# Patient Record
Sex: Female | Born: 1990 | Race: Black or African American | Hispanic: No | Marital: Single | Smoking: Never smoker
Health system: Southern US, Community
[De-identification: ages and names within clinical notes are randomized; demographics above are authoritative.]

## PROBLEM LIST (undated history)

## (undated) ENCOUNTER — Inpatient Hospital Stay (HOSPITAL_COMMUNITY): Payer: Self-pay

## (undated) ENCOUNTER — Emergency Department (HOSPITAL_COMMUNITY): Admission: EM | Payer: Self-pay | Source: Home / Self Care

## (undated) DIAGNOSIS — F419 Anxiety disorder, unspecified: Secondary | ICD-10-CM

## (undated) DIAGNOSIS — D649 Anemia, unspecified: Secondary | ICD-10-CM

## (undated) DIAGNOSIS — O3430 Maternal care for cervical incompetence, unspecified trimester: Secondary | ICD-10-CM

## (undated) DIAGNOSIS — N83209 Unspecified ovarian cyst, unspecified side: Secondary | ICD-10-CM

## (undated) DIAGNOSIS — B999 Unspecified infectious disease: Secondary | ICD-10-CM

## (undated) HISTORY — PX: NO PAST SURGERIES: SHX2092

## (undated) HISTORY — DX: Anxiety disorder, unspecified: F41.9

## (undated) HISTORY — DX: Unspecified infectious disease: B99.9

## (undated) HISTORY — DX: Maternal care for cervical incompetence, unspecified trimester: O34.30

---
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
## 2011-09-18 NOTE — L&D Delivery Note (Signed)
Delivery Note  Pt was having urge to push and vertex at +1, FHR w variable decel, AROM for clear fluid and pt pushed well, FHR stable  At 4:53 AM a viable female was delivered via Vaginal, Spontaneous Delivery (Presentation: Right Occiput Anterior).  Compound presentation w R arm, shoulders delivered easily, APGAR: 9, 9; weight 5 lb 13 oz (2637 g).   Placenta status: Intact, Spontaneous.  - to pathology Cord: 3 vessels with the following complications: None.  Cord pH: n/a  No IV was obtained, pt given cytotec PR, and pitocin 10units IM   Anesthesia: Local  Episiotomy: None Lacerations: 1st degree Suture Repair: 3.0 vicryl rapide Est. Blood Loss (mL): 300  Mom to postpartum.  Baby to nursery-stable. Skin-skin rooming in Pt plans to BF   Malekai Markwood M 06/05/2012, 6:07 AM

## 2012-02-29 ENCOUNTER — Inpatient Hospital Stay (HOSPITAL_COMMUNITY)
Admission: AD | Admit: 2012-02-29 | Discharge: 2012-02-29 | Disposition: A | Discharge status: Home / Self Care | Payer: Medicaid Other | Source: Ambulatory Visit | Attending: Obstetrics & Gynecology | Admitting: Obstetrics & Gynecology

## 2012-02-29 ENCOUNTER — Encounter (HOSPITAL_COMMUNITY): Payer: Self-pay | Admitting: *Deleted

## 2012-02-29 DIAGNOSIS — N390 Urinary tract infection, site not specified: Secondary | ICD-10-CM

## 2012-02-29 DIAGNOSIS — R109 Unspecified abdominal pain: Secondary | ICD-10-CM | POA: Insufficient documentation

## 2012-02-29 DIAGNOSIS — N949 Unspecified condition associated with female genital organs and menstrual cycle: Secondary | ICD-10-CM

## 2012-02-29 DIAGNOSIS — O99891 Other specified diseases and conditions complicating pregnancy: Secondary | ICD-10-CM | POA: Insufficient documentation

## 2012-02-29 DIAGNOSIS — R42 Dizziness and giddiness: Secondary | ICD-10-CM | POA: Insufficient documentation

## 2012-02-29 HISTORY — DX: Unspecified ovarian cyst, unspecified side: N83.209

## 2012-02-29 HISTORY — DX: Anemia, unspecified: D64.9

## 2012-02-29 LAB — WET PREP, GENITAL: Yeast Wet Prep HPF POC: NONE SEEN

## 2012-02-29 LAB — URINALYSIS, ROUTINE W REFLEX MICROSCOPIC
Hgb urine dipstick: NEGATIVE
Specific Gravity, Urine: 1.025 (ref 1.005–1.030)
Urobilinogen, UA: 0.2 mg/dL (ref 0.0–1.0)
pH: 6.5 (ref 5.0–8.0)

## 2012-02-29 LAB — URINE MICROSCOPIC-ADD ON

## 2012-02-29 MED ORDER — CEPHALEXIN 500 MG PO CAPS: 500.0000 mg | ORAL_CAPSULE | Freq: Four times a day (QID) | ORAL | Status: AC

## 2012-02-29 NOTE — MAU Note (Signed)
Pt states, " I've had sharp pain everywhere in my abdomen, and sometimes in the right lower abdomen for one week. Yesterday I was dizzy while sitting down then when I stood up it was worse but today I haven't felt dizzy."

## 2012-02-29 NOTE — MAU Provider Note (Signed)
History     CSN: [id number]  Arrival date and time: 02/29/12 1727   First Provider Initiated Contact with Patient 02/29/12 1833      Chief Complaint  Patient presents with  . Abdominal Pain  . Dizziness   HPI This is a 21 y.o. female at [redacted]w[redacted]d who presents with c/o lower abdominal pain, R>L.  Has had this pain for a week or two.  Denies bleeding or leaking. Does feel Flutters.   No fever or other somatic symptoms. Has not started prenatal care yet, but states has appt at [facility] in late June.   OB History    Grav Para Term Preterm Abortions TAB SAB Ect Mult Living   2 1 1       1       Past Medical History  Diagnosis Date  . Anemia   . Ovarian cyst     with current pregnancy     Past Surgical History  Procedure Date  . No past surgeries     Family History  Problem Relation Age of Onset  . Other Neg Hx     History  Substance Use Topics  . Smoking status: Never Smoker   . Smokeless tobacco: Never Used  . Alcohol Use: No    Allergies: No Known Allergies  Prescriptions prior to admission  Medication Sig Dispense Refill  . Prenatal Vit-Fe Fumarate-FA (PRENATAL MULTIVITAMIN) TABS Take 1 tablet by mouth daily.        ROS See HPI  Physical Exam   Blood pressure 125/62, pulse 80, temperature 97.8 F (36.6 C), temperature source Oral, resp. rate 20, height 5\' 4"  (1.626 m), weight 124 lb 4 oz (56.359 kg).  Physical Exam  Constitutional: She is oriented to person, place, and time. She appears well-developed and well-nourished.  HENT:  Head: Normocephalic.  Cardiovascular: Normal rate.   Respiratory: Effort normal.  GI: Soft. She exhibits no distension and no mass. There is tenderness. There is no rebound and no guarding.  Genitourinary: Vagina normal and uterus normal. No vaginal discharge found.       Cervix long and closed  Musculoskeletal: Normal range of motion.  Neurological: She is alert and oriented to person, place, and time.  Skin: Skin is warm and  dry.  Psychiatric: She has a normal mood and affect.   Results for orders placed during the hospital encounter of 02/29/12 (from the past 24 hour(s))  URINALYSIS, ROUTINE W REFLEX MICROSCOPIC     Status: Abnormal   Collection Time   02/29/12  5:56 PM      Component Value Range   Color, Urine YELLOW  YELLOW   APPearance CLEAR  CLEAR   Specific Gravity, Urine 1.025  1.005 - 1.030   pH 6.5  5.0 - 8.0   Glucose, UA NEGATIVE  NEGATIVE mg/dL   Hgb urine dipstick NEGATIVE  NEGATIVE   Bilirubin Urine NEGATIVE  NEGATIVE   Ketones, ur NEGATIVE  NEGATIVE mg/dL   Protein, ur NEGATIVE  NEGATIVE mg/dL   Urobilinogen, UA 0.2  0.0 - 1.0 mg/dL   Nitrite NEGATIVE  NEGATIVE   Leukocytes, UA SMALL (*) NEGATIVE  URINE MICROSCOPIC-ADD ON     Status: Abnormal   Collection Time   02/29/12  5:56 PM      Component Value Range   Squamous Epithelial / LPF FEW (*) RARE   WBC, UA 0-2  <3 WBC/hpf   RBC / HPF 0-2  <3 RBC/hpf   Bacteria, UA MANY (*) RARE  Wet prep negative except for WBCs  MAU Course  Procedures  MDM Will culture urine and treat presumptively.   Assessment and Plan  A:  SIUP at [redacted]w[redacted]d       Probable round ligament pain      Reassuring fetal status      Possible UTI       GC/Chlamydia and wet prep done  P:  Discharge home      Comfort measures      Rx keflex      Urine to culture  [facility] 02/29/2012, 6:56 PM

## 2012-02-29 NOTE — Discharge Instructions (Signed)
Urinary Tract Infection Infections of the urinary tract can start in several places. A bladder infection (cystitis), a kidney infection (pyelonephritis), and a prostate infection (prostatitis) are different types of urinary tract infections (UTIs). They usually get better if treated with medicines (antibiotics) that kill germs. Take all the medicine until it is gone. You or your child may feel better in a few days, but TAKE ALL MEDICINE or the infection may not respond and may become more difficult to treat. HOME CARE INSTRUCTIONS   Drink enough water and fluids to keep the urine clear or pale yellow. Cranberry juice is especially recommended, in addition to large amounts of water.   Avoid caffeine, tea, and carbonated beverages. They tend to irritate the bladder.   Alcohol may irritate the prostate.   Only take over-the-counter or prescription medicines for pain, discomfort, or fever as directed by your caregiver.  To prevent further infections:  Empty the bladder often. Avoid holding urine for long periods of time.   After a bowel movement, women should cleanse from front to back. Use each tissue only once.   Empty the bladder before and after sexual intercourse.  FINDING OUT THE RESULTS OF YOUR TEST Not all test results are available during your visit. If your or your child's test results are not back during the visit, make an appointment with your caregiver to find out the results. Do not assume everything is normal if you have not heard from your caregiver or the medical facility. It is important for you to follow up on all test results. SEEK MEDICAL CARE IF:   There is back pain.   Your baby is older than 3 months with a rectal temperature of 100.5 F (38.1 C) or higher for more than 1 day.   Your or your child's problems (symptoms) are no better in 3 days. Return sooner if you or your child is getting worse.  SEEK IMMEDIATE MEDICAL CARE IF:   There is severe back pain or lower  abdominal pain.   You or your child develops chills.   You have a fever.   Your baby is older than 3 months with a rectal temperature of 102 F (38.9 C) or higher.   Your baby is 31 months old or younger with a rectal temperature of 100.4 F (38 C) or higher.   There is nausea or vomiting.   There is continued burning or discomfort with urination.  MAKE SURE YOU:   Understand these instructions.   Will watch your condition.   Will get help right away if you are not doing well or get worse.  Document Released: 06/13/2005 Document Revised: 08/23/2011 Document Reviewed: 01/16/2007 [facility] Patient Information 2012 [place], [place].  Round Ligament Pain The round ligament is made up of muscle and fibrous tissue. It is attached to the uterus near the fallopian tube. The round ligament is located on both sides of the uterus and helps support the position of the uterus. It usually begins in the second trimester of pregnancy when the uterus comes out of the pelvis. The pain can come and go until the baby is delivered. Round ligament pain is not a serious problem and does not cause harm to the baby. CAUSE During pregnancy the uterus grows the most from the second trimester to delivery. As it grows, it stretches and slightly twists the round ligaments. When the uterus leans from one side to the other, the round ligament on the opposite side pulls and stretches. This can cause  pain. SYMPTOMS  Pain can occur on one side or both sides. The pain is usually a short, sharp, and pinching-like. Sometimes it can be a dull, lingering and aching pain. The pain is located in the lower side of the abdomen or in the groin. The pain is internal and usually starts deep in the groin and moves up to the outside of the hip area. Pain can occur with:  Sudden change in position like getting out of bed or a chair.   Rolling over in bed.   Coughing or sneezing.   Walking too much.   Any type of physical  activity.  DIAGNOSIS  Your caregiver will make sure there are no serious problems causing the pain. When nothing serious is found, the symptoms usually indicate that the pain is from the round ligament. TREATMENT   Sit down and relax when the pain starts.   Flex your knees up to your belly.   Lay on your side with a pillow under your belly (abdomen) and another one between your legs.   Sit in a hot bath for 15 to 20 minutes or until the pain goes away.  HOME CARE INSTRUCTIONS   Only take over-the-counter or prescriptions medicines for pain, discomfort or fever as directed by your caregiver.   Sit and stand slowly.   Avoid long walks if it causes pain.   Stop or lessen your physical activities if it causes pain.  SEEK MEDICAL CARE IF:   The pain does not go away with any of your treatment.   You need stronger medication for the pain.   You develop back pain that you did not have before with the side pain.  SEEK IMMEDIATE MEDICAL CARE IF:   You develop a temperature of 102 F (38.9 C) or higher.   You develop uterine contractions.   You develop vaginal bleeding.   You develop nausea, vomiting or diarrhea.   You develop chills.   You have pain when you urinate.  Document Released: 06/12/2008 Document Revised: 08/23/2011 Document Reviewed: 06/12/2008 [facility] Patient Information 2012 [place], [place].

## 2012-03-01 LAB — GC/CHLAMYDIA PROBE AMP, GENITAL: Chlamydia, DNA Probe: NEGATIVE

## 2012-03-03 LAB — URINE CULTURE: Colony Count: 2000

## 2012-03-07 ENCOUNTER — Other Ambulatory Visit: Payer: Self-pay | Admitting: Advanced Practice Midwife

## 2012-03-07 MED ORDER — PENICILLIN V POTASSIUM 500 MG PO TABS: 500.0000 mg | ORAL_TABLET | Freq: Four times a day (QID) | ORAL | Status: AC

## 2012-03-12 ENCOUNTER — Telehealth: Payer: Self-pay | Admitting: *Deleted

## 2012-03-12 NOTE — Telephone Encounter (Signed)
Called [facility] as requested and notified her of uti with GBS and need for antibiotics and that has already been sent to her pharmacy- instructed her to take penicillin as order 4 times a day until all medicine gone. Patient voices understanding.

## 2012-03-12 NOTE — Telephone Encounter (Signed)
Message copied by Gerome Apley on Wed Mar 12, 2012 12:10 PM ------      Message from: Aviva Signs      Created: Fri Mar 07, 2012 12:41 PM      Regarding: treatment for GBS urine       I put in an order for Penicillin for her GBS urine            Can you call her?

## 2012-03-14 ENCOUNTER — Ambulatory Visit (INDEPENDENT_AMBULATORY_CARE_PROVIDER_SITE_OTHER): Payer: Medicaid Other | Admitting: Obstetrics and Gynecology

## 2012-03-14 ENCOUNTER — Encounter: Payer: Medicaid Other | Admitting: Obstetrics and Gynecology

## 2012-03-14 DIAGNOSIS — Z331 Pregnant state, incidental: Secondary | ICD-10-CM

## 2012-03-14 NOTE — Progress Notes (Signed)
PT STATES WAS GIVEN RX FOR KEFLEX WHEN SEEN AT [facility]U. WAS JUST CALLED AND TOLD PCN WAS PRESCRIBED DUE TO +GBS IN URINE.  PER MK TO D/C KEFLEX.  PT STATES HAS CONT APPROX 5/DAY.  ONLY DRINKING 1 BOTTLE OF WATER. ADVISED TO INCREASE TO 8-10 GLASSES /DAY.  ANATOMY U/S SCHED AT [facility] W/U 03/18/12.  RECORDS REQUESTED FROM CLINIC FOR INITIAL U/S. PT TO CALL WITH CONTINUED CONT. OR ANY CONCERNS.  Pt verbalizes comprehension.

## 2012-03-15 LAB — PRENATAL PANEL VII
Antibody Screen: NEGATIVE
Basophils Absolute: 0 10*3/uL (ref 0.0–0.1)
Basophils Relative: 0 % (ref 0–1)
Eosinophils Absolute: 0.1 10*3/uL (ref 0.0–0.7)
Eosinophils Relative: 2 % (ref 0–5)
HCT: 31.6 % — ABNORMAL LOW (ref 36.0–46.0)
MCHC: 33.9 g/dL (ref 30.0–36.0)
MCV: 87.3 fL (ref 78.0–100.0)
Monocytes Absolute: 1.4 10*3/uL — ABNORMAL HIGH (ref 0.1–1.0)
RDW: 13.4 % (ref 11.5–15.5)
Rh Type: POSITIVE

## 2012-03-18 ENCOUNTER — Ambulatory Visit (INDEPENDENT_AMBULATORY_CARE_PROVIDER_SITE_OTHER): Payer: Medicaid Other | Admitting: Obstetrics and Gynecology

## 2012-03-18 ENCOUNTER — Ambulatory Visit (INDEPENDENT_AMBULATORY_CARE_PROVIDER_SITE_OTHER): Payer: Medicaid Other

## 2012-03-18 ENCOUNTER — Other Ambulatory Visit: Payer: Self-pay | Admitting: Obstetrics and Gynecology

## 2012-03-18 VITALS — BP 120/80 | Wt 129.0 lb

## 2012-03-18 DIAGNOSIS — F411 Generalized anxiety disorder: Secondary | ICD-10-CM

## 2012-03-18 DIAGNOSIS — Z331 Pregnant state, incidental: Secondary | ICD-10-CM

## 2012-03-18 DIAGNOSIS — F419 Anxiety disorder, unspecified: Secondary | ICD-10-CM

## 2012-03-18 DIAGNOSIS — O0930 Supervision of pregnancy with insufficient antenatal care, unspecified trimester: Secondary | ICD-10-CM

## 2012-03-18 DIAGNOSIS — J45909 Unspecified asthma, uncomplicated: Secondary | ICD-10-CM

## 2012-03-18 DIAGNOSIS — Z3689 Encounter for other specified antenatal screening: Secondary | ICD-10-CM

## 2012-03-18 LAB — POCT WET PREP (WET MOUNT)
Bacteria Wet Prep HPF POC: NEGATIVE
Trichomonas Wet Prep HPF POC: NEGATIVE
pH: 5

## 2012-03-18 LAB — US OB COMP + 14 WK

## 2012-03-18 LAB — HEMOGLOBINOPATHY EVALUATION
Hgb A2 Quant: 2.8 % (ref 2.2–3.2)
Hgb F Quant: 0 % (ref 0.0–2.0)

## 2012-03-18 LAB — HEMOGLOBIN: Hemoglobin: 10.7 g/dL — ABNORMAL LOW (ref 12.0–15.0)

## 2012-03-18 NOTE — Progress Notes (Signed)
Patient had been seen at the hospital for initial [facility] [place].  To f/u at office for anatomy USS and initial examination [place] [redacted]w[redacted]d G2p1001 Late to [facility] dated with LMP USS anatomy: VX Pres, Posterior placenta Fluid 75th % Gender suggestive of female - ?? Hypospadias, mildly hypocoiled umbilical cord will have f/u USS for growth eval Subjective:    Tracy Hanna is being seen today for her first obstetrical visit.  She reports that she ahd initial Ob care at [facility] Had been Dx with Strep B(urine) Tx 'ed Penicillin V 500mg s po QID x 7 days Non smoker, No drugs, no alcohol, Pap x2 years ago She is at [redacted]w[redacted]d gestation by USS dating and LMP 09/10/11 - certain of dates   Her obstetrical history is significant for: SVD x1 2009 no complications   Relationship with FOB: declines to disclose  Patient intend to breast feed.  Pregnancy history fully reviewed  Review of Systems Pertinent ROS is described in HPI   Objective:   BP 120/80  Wt 129 lb (58.514 kg)  LMP 09/10/2011 Wt Readings from Last 1 Encounters:  03/18/12 129 lb (58.514 kg)   BMI: <30  General: alert, cooperative and no distress Respiratory: clear to auscultation bilaterally Cardiovascular: regular rate and rhythm, S1, S2 normal, no murmur Gastrointestinal: soft, non-tender; no masses,  no organomegaly Extremities: extremities normal, no pain or edema Vaginal Bleeding: none  EXTERNAL GENITALIA: normal appearing vulva with no masses, tenderness or lesions VAGINA: no abnormal discharge or lesions CERVIX: no lesions or cervical motion tenderness UTERUS: gravid and consistent with 27 weeks ADNEXA: no masses palpable and nontender OB EXAM PELVIMETRY: outlet adequate   FHR:  155bpm doppler  Assessment:    Pregnancy at   Plan: To have ROB in 2 week     Prenatal panel reviewed and discussed with the patient: labs reviewed with patient Discussed GBS status Pap smear collected: Yes GC/Chlamydia collected:yes Discussion of  Genetic testing options: declined Prenatal vitamins recommended - refill requested Problem list reviewed and updated.  Plan of care: Review 1hr gtt next visit, review results of GC/ Chlamydia and Pap ROB: x 2 wks  Repeat USS x3 wks for growth and to check Hypo-coiled cord ? Possible better view of Hypospadias   Follow up in 2 wks with USS scheduled 3 weeks  Earl Gala CNM, [place] 03/18/2012 6:08 PM

## 2012-03-18 NOTE — Progress Notes (Signed)
Last Pap was 2 year ago per pt "WNL". Pt is unsure of her Pregravid Wt. Pt states she has no concerns at this time. 1 GTT today.

## 2012-03-21 LAB — PAP IG, CT-NG, RFX HPV ASCU: Chlamydia Probe Amp: NEGATIVE

## 2012-04-01 ENCOUNTER — Encounter: Payer: Medicaid Other | Admitting: Obstetrics and Gynecology

## 2012-04-03 ENCOUNTER — Encounter: Payer: Self-pay | Admitting: Obstetrics and Gynecology

## 2012-04-03 ENCOUNTER — Ambulatory Visit (INDEPENDENT_AMBULATORY_CARE_PROVIDER_SITE_OTHER): Payer: Medicaid Other | Admitting: Obstetrics and Gynecology

## 2012-04-03 DIAGNOSIS — O359XX0 Maternal care for (suspected) fetal abnormality and damage, unspecified, not applicable or unspecified: Secondary | ICD-10-CM

## 2012-04-03 DIAGNOSIS — O358XX0 Maternal care for other (suspected) fetal abnormality and damage, not applicable or unspecified: Secondary | ICD-10-CM

## 2012-04-03 NOTE — Progress Notes (Signed)
1 gtt 86 Hemoglobin 10.7 RPR NR No concerns per pt

## 2012-04-03 NOTE — Progress Notes (Signed)
Doing well. Reviewed labs. Needs [place] next week for re-check of ? Hypospadius, hypercoiled cord.,

## 2012-04-08 ENCOUNTER — Ambulatory Visit (INDEPENDENT_AMBULATORY_CARE_PROVIDER_SITE_OTHER): Payer: Medicaid Other | Admitting: Obstetrics and Gynecology

## 2012-04-08 ENCOUNTER — Ambulatory Visit (INDEPENDENT_AMBULATORY_CARE_PROVIDER_SITE_OTHER): Payer: Medicaid Other

## 2012-04-08 VITALS — BP 106/70 | Wt 131.0 lb

## 2012-04-08 DIAGNOSIS — Z3490 Encounter for supervision of normal pregnancy, unspecified, unspecified trimester: Secondary | ICD-10-CM

## 2012-04-08 DIAGNOSIS — Z331 Pregnant state, incidental: Secondary | ICD-10-CM

## 2012-04-08 DIAGNOSIS — O358XX0 Maternal care for other (suspected) fetal abnormality and damage, not applicable or unspecified: Secondary | ICD-10-CM

## 2012-04-08 LAB — US OB FOLLOW UP

## 2012-04-08 NOTE — Progress Notes (Signed)
Pt states she has no concerns today.  

## 2012-04-08 NOTE — Progress Notes (Signed)
[redacted]w[redacted]d F/u USS: Growth and Genital Anatomy WUJ:[id number]: Vx, Posterior Placenta, No Previa, Normal fluid: AFI =60th %tile Normal Growth = EFW 50th%tile Fetal Genetalia: Findings again suggest Hypospadias - See #3 Images. Differential to include labia with large clitoris, or Bifid scrotum with short penis > ambiguous Genitalia Patient reassured. No other complaints  No change in vaginal secretions. ROB 2 weeks

## 2012-04-09 ENCOUNTER — Encounter (HOSPITAL_COMMUNITY): Payer: Self-pay | Admitting: *Deleted

## 2012-04-09 ENCOUNTER — Inpatient Hospital Stay (HOSPITAL_COMMUNITY): Payer: Medicaid Other

## 2012-04-09 ENCOUNTER — Inpatient Hospital Stay (HOSPITAL_COMMUNITY)
Admission: AD | Admit: 2012-04-09 | Discharge: 2012-04-10 | DRG: 778 | Disposition: A | Discharge status: Home / Self Care | Payer: Medicaid Other | Source: Ambulatory Visit | Attending: Obstetrics and Gynecology | Admitting: Obstetrics and Gynecology

## 2012-04-09 ENCOUNTER — Encounter (HOSPITAL_COMMUNITY): Payer: Medicaid Other

## 2012-04-09 DIAGNOSIS — O4700 False labor before 37 completed weeks of gestation, unspecified trimester: Secondary | ICD-10-CM

## 2012-04-09 DIAGNOSIS — O3430 Maternal care for cervical incompetence, unspecified trimester: Secondary | ICD-10-CM

## 2012-04-09 DIAGNOSIS — O359XX0 Maternal care for (suspected) fetal abnormality and damage, unspecified, not applicable or unspecified: Secondary | ICD-10-CM

## 2012-04-09 DIAGNOSIS — Z22330 Carrier of Group B streptococcus: Secondary | ICD-10-CM

## 2012-04-09 DIAGNOSIS — IMO0001 Reserved for inherently not codable concepts without codable children: Secondary | ICD-10-CM | POA: Diagnosis present

## 2012-04-09 DIAGNOSIS — O09299 Supervision of pregnancy with other poor reproductive or obstetric history, unspecified trimester: Secondary | ICD-10-CM

## 2012-04-09 DIAGNOSIS — R9389 Abnormal findings on diagnostic imaging of other specified body structures: Secondary | ICD-10-CM

## 2012-04-09 DIAGNOSIS — Z3689 Encounter for other specified antenatal screening: Secondary | ICD-10-CM

## 2012-04-09 DIAGNOSIS — O0930 Supervision of pregnancy with insufficient antenatal care, unspecified trimester: Secondary | ICD-10-CM

## 2012-04-09 DIAGNOSIS — O358XX0 Maternal care for other (suspected) fetal abnormality and damage, not applicable or unspecified: Secondary | ICD-10-CM | POA: Diagnosis present

## 2012-04-09 DIAGNOSIS — O99891 Other specified diseases and conditions complicating pregnancy: Secondary | ICD-10-CM | POA: Diagnosis present

## 2012-04-09 HISTORY — DX: Maternal care for cervical incompetence, unspecified trimester: O34.30

## 2012-04-09 LAB — URINALYSIS, ROUTINE W REFLEX MICROSCOPIC
Bilirubin Urine: NEGATIVE
Glucose, UA: NEGATIVE mg/dL
Hgb urine dipstick: NEGATIVE
Ketones, ur: NEGATIVE mg/dL
Nitrite: NEGATIVE
pH: 6 (ref 5.0–8.0)

## 2012-04-09 LAB — COMPREHENSIVE METABOLIC PANEL
Albumin: 2.8 g/dL — ABNORMAL LOW (ref 3.5–5.2)
BUN: 9 mg/dL (ref 6–23)
Creatinine, Ser: 0.6 mg/dL (ref 0.50–1.10)
Potassium: 3.7 mEq/L (ref 3.5–5.1)
Total Protein: 6.6 g/dL (ref 6.0–8.3)

## 2012-04-09 LAB — CBC
HCT: 33.1 % — ABNORMAL LOW (ref 36.0–46.0)
MCV: 88.7 fL (ref 78.0–100.0)
RBC: 3.73 MIL/uL — ABNORMAL LOW (ref 3.87–5.11)
WBC: 9.3 10*3/uL (ref 4.0–10.5)

## 2012-04-09 LAB — WET PREP, GENITAL
Clue Cells Wet Prep HPF POC: NONE SEEN
Trich, Wet Prep: NONE SEEN
Yeast Wet Prep HPF POC: NONE SEEN

## 2012-04-09 LAB — URINE MICROSCOPIC-ADD ON

## 2012-04-09 MED ORDER — LACTATED RINGERS IV SOLN
INTRAVENOUS | Status: DC
  Administered 2012-04-09 – 2012-04-10 (×2): via INTRAVENOUS

## 2012-04-09 MED ORDER — HYDROXYZINE HCL 50 MG PO TABS
50.0000 mg | ORAL_TABLET | Freq: Four times a day (QID) | ORAL | Status: DC | PRN
  Filled 2012-04-09: qty 1

## 2012-04-09 MED ORDER — BETAMETHASONE SOD PHOS & ACET 6 (3-3) MG/ML IJ SUSP
12.0000 mg | INTRAMUSCULAR | Status: AC
  Administered 2012-04-09 – 2012-04-10 (×2): 12 mg via INTRAMUSCULAR
  Filled 2012-04-09 (×2): qty 2

## 2012-04-09 MED ORDER — MAGNESIUM SULFATE 40 G IN LACTATED RINGERS - SIMPLE
2.0000 g/h | INTRAVENOUS | Status: DC
  Administered 2012-04-10: 2 g/h via INTRAVENOUS
  Filled 2012-04-09 (×2): qty 500

## 2012-04-09 MED ORDER — CALCIUM CARBONATE ANTACID 500 MG PO CHEW: 2.0000 | CHEWABLE_TABLET | ORAL | Status: DC | PRN

## 2012-04-09 MED ORDER — ZOLPIDEM TARTRATE 5 MG PO TABS: 5.0000 mg | ORAL_TABLET | Freq: Every evening | ORAL | Status: DC | PRN

## 2012-04-09 MED ORDER — PRENATAL MULTIVITAMIN CH
1.0000 | ORAL_TABLET | Freq: Every day | ORAL | Status: DC
  Administered 2012-04-09 – 2012-04-10 (×2): 1 via ORAL
  Filled 2012-04-09 (×2): qty 1

## 2012-04-09 MED ORDER — ACETAMINOPHEN 325 MG PO TABS: 650.0000 mg | ORAL_TABLET | ORAL | Status: DC | PRN

## 2012-04-09 MED ORDER — DOCUSATE SODIUM 100 MG PO CAPS
100.0000 mg | ORAL_CAPSULE | Freq: Every day | ORAL | Status: DC
  Administered 2012-04-09 – 2012-04-10 (×2): 100 mg via ORAL
  Filled 2012-04-09 (×2): qty 1

## 2012-04-09 MED ORDER — MAGNESIUM SULFATE 40 G IN LACTATED RINGERS - SIMPLE: 2.0000 g/h | Freq: Once | INTRAVENOUS | Status: DC

## 2012-04-09 MED ORDER — MAGNESIUM SULFATE 40 MG/ML IJ SOLN: 4.0000 g | Freq: Once | INTRAMUSCULAR | Status: DC

## 2012-04-09 MED ORDER — MAGNESIUM SULFATE BOLUS VIA INFUSION
4.0000 g | Freq: Once | INTRAVENOUS | Status: AC
  Administered 2012-04-09: 4 g via INTRAVENOUS
  Filled 2012-04-09: qty 500

## 2012-04-09 MED ORDER — PENICILLIN G POTASSIUM 5000000 UNITS IJ SOLR
2.5000 10*6.[IU] | INTRAVENOUS | Status: DC
  Administered 2012-04-09 – 2012-04-10 (×8): 2.5 10*6.[IU] via INTRAVENOUS
  Filled 2012-04-09 (×12): qty 2.5

## 2012-04-09 MED ORDER — PENICILLIN G POTASSIUM 5000000 UNITS IJ SOLR
5.0000 10*6.[IU] | Freq: Once | INTRAVENOUS | Status: AC
  Administered 2012-04-09: 5 10*6.[IU] via INTRAVENOUS
  Filled 2012-04-09: qty 5

## 2012-04-09 NOTE — Progress Notes (Signed)
SW aware of consult, but had not had the ability to meet with patient to complete assessment today.  SW spoke to RN, who states that patient will go home tomorrow evening at the earliest.  SW asked RN if patient has contacted someone regarding the young child in the room with patient and she said no.  SW cannot do anything about this unless hospital staff feels that the situation is unsafe and CPS needs to be contacted.  At this point, SW does not feel this in the case.  SW to attempt to meet with patient in the morning.

## 2012-04-09 NOTE — Progress Notes (Signed)
Patient ID: Tracy Hanna, female   DOB: 11-Dec-1990, 21 y.o.   MRN: [id number] Tracy Hanna is a 21 y.o. G2P1001 at [redacted]w[redacted]d by LMP admitted for Preterm labor  Subjective: GI: negative GU: Denies: dysuria, frequency/urgency, vaginal bleeding, pelvic pain OB: Good fetal movement        Objective: BP 115/63  Pulse 90  Temp 98.5 F (36.9 C) (Oral)  Resp 20  Ht 5\' 2"  (1.575 m)  Wt 122 lb 11.2 oz (55.656 kg)  BMI 22.44 kg/m2  SpO2 98%  LMP 09/10/2011 I/O last 3 completed shifts: In: 1400.4 [P.O.:360; I.V.:690.4; IV Piggyback:350] Out: 500 [Urine:500] Total I/O In: 1665 [P.O.:540; I.V.:1125] Out: -   FHT:  FHR: 140s bpm, variability: minimal ,  accelerations:  Abscent,  decelerations:  Absent UC:   rare SVE:   Dilation: 3 Effacement (%): 70 Station: -2 Exam by:: S Lillard CNM  Labs: Lab Results  Component Value Date   WBC 9.3 04/09/2012   HGB 10.7* 04/09/2012   HCT 33.1* 04/09/2012   MCV 88.7 04/09/2012   PLT 222 04/09/2012    Assessment : Preterm labor, with contractions resolved on Magnesium Unclear gender determination on prior U/S  Fetal Wellbeing:  Category II  Plan: Second BMZ due at 2am Will discontinue Magnesium at 8am, and observe for stability Results of MFM consult and U/S pending. 1hr glucola in 2 weeks Robi Dewolfe P 04/09/2012, 5:39 PM

## 2012-04-09 NOTE — H&P (Signed)
Tracy Hanna is a 21 y.o. female presenting for ctx/cramping. Pt is G2P1 at [redacted]w[redacted]d that arrived unannounced with c/o ctx that started about 8pm, feels like they have gotten stronger and are a few minutes apart. She denies any LOF, VB, reports +FM.   Pregnancy significant for: 1. LTC at 27wks 2. ? Fetal genitalia, suggestive female hypospadium 3. ? Hx of PROM w prev preg at 32wks, had SVD at term 4. hypercoiled umbilical cord 5. Anxiety - no meds 6. +GBS bacturia - txd at 27wks  HPI: Pt was seen in [facility]U at 24wks for RLP and txd for UTI w keflex initially, ua cx was then +GBS and she was switched to PenVK PO. She began [facility] at 27wks. Anatomy US was done and gender was questionable w suggestive female hypospadias. Otherwise [place] was normal and c/w dates w EDC of 06/16/12. F/U US was done today and was otherwise normal, again ? Gender/hypospadias.  Pt had normal 1hr gtt, hgb =10.7, RPR NR. Pt had pap smear done w NOB exam on 03-18-12, results are not available in epic today.  Maternal Medical History:  Reason for admission: Reason for admission: contractions.  Contractions: Onset was 3-5 hours ago.   Frequency: regular.   Duration is approximately 50 seconds.   Perceived severity is moderate.    Fetal activity: Perceived fetal activity is normal.   Last perceived fetal movement was within the past hour.    Prenatal complications: no prenatal complications   OB History    Grav Para Term Preterm Abortions TAB SAB Ect Mult Living   2 1 1       1      Per pt, was admitted at 32wks for ?PROM, no ctx, had SVD at term   Past Medical History  Diagnosis Date  . Anemia   . Ovarian cyst     with current pregnancy   . Anxiety AGE 52    NO MEDS CURRENTLY  . Infection     UTI WITH PREGNANCY  . Infection 2008    TRICH  . Infection     OCC yeast   Past Surgical History  Procedure Date  . No past surgeries    Family History: family history includes Arthritis in her maternal grandfather  and maternal grandmother; Asthma in her brother, father, maternal aunt, and sister; Diabetes in her maternal grandmother; Early death (age of onset:5) in her sister; and Hypertension in her father, maternal grandmother, and mother.  There is no history of Other. Social History:  reports that she has never smoked. She has never used smokeless tobacco. She reports that she does not drink alcohol or use illicit drugs.   Prenatal Transfer Tool  Maternal Diabetes: No Genetic Screening: Declined Maternal Ultrasounds/Referrals: Abnormal:  Findings:   Other: Please see prenatal record for details Fetal Ultrasounds or other Referrals:  None Maternal Substance Abuse:  No Significant Maternal Medications:  None Significant Maternal Lab Results:  None Other Comments:  ? fetal gender, suggestive fetal hypospadias   Review of Systems  All other systems reviewed and are negative.    Dilation: 3 Effacement (%): 70 Station: -2 Exam by:: S Kadin Canipe CNM Blood pressure 126/73, pulse 88, temperature 97.9 F (36.6 C), temperature source Oral, resp. rate 16, height 5\' 2"  (1.575 m), weight 133 lb (60.328 kg), last menstrual period 09/10/2011, SpO2 99.00%. Maternal Exam:  Uterine Assessment: Contraction strength is mild.  Contraction duration is 50 seconds. Contraction frequency is regular.   Abdomen: Patient reports  no abdominal tenderness. Fundal height is aga.   Estimated fetal weight is 3#4oz on [place] 04/08/12 .   Fetal presentation: vertex  Introitus: Normal vulva. Vagina is positive for vaginal discharge.  Ferning test: not done.  lg amt clear/milky discharge  Pelvis: adequate for delivery.   Cervix: Cervix evaluated by sterile speculum exam and digital exam.   Cx=3/75/0 vtx  Fetal Exam Fetal Monitor Review: Mode: ultrasound.   Baseline rate: 140.  Variability: moderate (6-25 bpm).   Pattern: no accelerations and no decelerations.    Fetal State Assessment: Category II - tracings are  indeterminate.     Physical Exam  Nursing note and vitals reviewed. Constitutional: She is oriented to person, place, and time. She appears well-developed and well-nourished. No distress.       9year old daughter at bs  HENT:  Head: Normocephalic.  Neck: Normal range of motion. Neck supple.  Cardiovascular: Normal rate, regular rhythm and normal heart sounds.   Respiratory: Effort normal and breath sounds normal.  GI: Soft. Bowel sounds are normal.  Genitourinary: Cervix exhibits discharge and friability. Vaginal discharge found.  Musculoskeletal: Normal range of motion. She exhibits no edema.  Neurological: She is alert and oriented to person, place, and time.  Skin: Skin is warm and dry.  Psychiatric: She has a normal mood and affect. Her behavior is normal. Judgment and thought content normal.       Pt tearful when discussing admission to hospital     Prenatal labs: ABO, Rh: A/POS/-- (06/28 1122) Antibody: NEG (06/28 1122) Rubella: 149.4 (06/28 1122) RPR: NON REAC (07/02 1539)  HBsAg: NEGATIVE (06/28 1122)  HIV: NON REACTIVE (06/28 1122)  GBS:   pos bacturia GC/CT neg hgb electroph - neg Pap - pending  Assessment/Plan: IUP at [redacted]w[redacted]d Preterm labor FHR reassuring, 10x10 accels  Admit to antenatal per consult w Dr Anise Salvo Sulfate 4g bolus then 2g/hour BMZ course Pen G units then 2.5 every 4 hours GC/CT sent Wet prep - neg, except for +WBC's CBC, CMET  [facility] - sent to cx NICU consult    Bhavesh Vazquez M 04/09/2012, 2:15 AM

## 2012-04-09 NOTE — MAU Note (Signed)
Pt states ucs getting stronger.

## 2012-04-09 NOTE — Consult Note (Signed)
Asked by Dr Normand Sloop to speak to Tracy Hanna regarding outcome of preterm infant. I reviewed the chart and spoke to her before MFM note was available. Maternal Hx: 30 2/[redacted] weeks gestation, GBS bacteriuria. Fetal US showed growrh at 50%, normal amniotic fluid, hypospadias noted. Mom received Pen G, Betamethasone, and Magnesium sulfate. I discussed general outcome and morbidities at about 30 wks. Discussed resuscitation, RDS, various respiratory support, HAL, gavage feeding, immature immune system, and LOS. Discussed difference in outcome if pregnancy is carried through at least 34 weeks.  Discussed benefits of breastfeeding.  Thank you for this consult.  Tracy Hanna Q  Face to face time 30 min.

## 2012-04-09 NOTE — Progress Notes (Signed)
MFM Note  Ms. Tracy Hanna is a 21 year old G2P1 AA female at 30+ weeks who was admitted last night for preterm contractions. Ms. Tracy Hanna began her prenatal care at the [facility] a few weeks ago when she presented for abdominal pain. The urine culture obtained at that visit was positive for GBS. She was given a course of penicillin. Her first visit with [facility] was at 27 weeks. A fetal ultrasound revealed a normally grown singleton with normal amniotic fluid volume. 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 A follow-up US showed appropriate growth but the genitalia remained ambiguous.  Since admission, she has been given BMZ, magnesium sulfate and IV penicillin. Her admission cervical exam was 3/70%. Currently, she does not appear to be uncomfortable and reports only one contraction in the past hour.   Ultrasound today: singleton in cephalic position, normal AFV, posterior placenta, ambiguous genitalia without other abnormalities, EFW at the 31st %tile with the [facility] at the 9th %tile and UA dopplers were WNL for this GA  OB history: S/P SVD of a 5+14 female at 38 weeks; complicated only by possible PPROM and was hospitalized for two weeks and then dc'ed home.  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 Tracy Hanna would like dffDNA for genetic gender determination - could she come back to [facility] to get her blood drawn in the morning? (Must check test that includes evaluation of X and Y - it is separate from T21,18 and 13) 3) Follow-up ultrasound for growth in 3 weeks  It was a pleasure meeting Ms. Tracy Hanna and her daughter.  (Face-to-face consultation with patient: 45 min)

## 2012-04-09 NOTE — MAU Note (Signed)
Pt c/o contractions gradually more painful since 2000.

## 2012-04-09 NOTE — Plan of Care (Signed)
Problem: Consults Goal: Birthing Suites Patient Information Press F2 to bring up selections list   Pt < [redacted] weeks EGA     

## 2012-04-10 ENCOUNTER — Inpatient Hospital Stay (HOSPITAL_COMMUNITY): Payer: Medicaid Other

## 2012-04-10 ENCOUNTER — Other Ambulatory Visit: Payer: Self-pay

## 2012-04-10 DIAGNOSIS — O4700 False labor before 37 completed weeks of gestation, unspecified trimester: Secondary | ICD-10-CM

## 2012-04-10 LAB — GC/CHLAMYDIA PROBE AMP, GENITAL: Chlamydia, DNA Probe: NEGATIVE

## 2012-04-10 LAB — URINE CULTURE

## 2012-04-10 NOTE — Progress Notes (Signed)
Harmony drawn via MFM nurse.

## 2012-04-10 NOTE — Progress Notes (Signed)
mfm recommended labs drawn by their nurse here at patient's bedside. Pt tolerated procedure well. Reactive tracing noted by pt's RN and second reviewer [email or web address], rn. Pt ready to go home now.

## 2012-04-10 NOTE — Progress Notes (Signed)
UR Chart review completed.  

## 2012-04-10 NOTE — Progress Notes (Addendum)
Patient ID: Tracy Hanna, female   DOB: 01-01-1991, 21 y.o.   MRN: [id number] Tracy Hanna is a 21 y.o. G2P1001 at [redacted]w[redacted]d admitted for preterm labor  Hospital Day No: 2  Subjective: No lof, denies ctxs, vb and +FM  Pregnancy complications: preterm labor  Objective: BP 108/53  Pulse 94  Temp 98.2 F (36.8 C) (Oral)  Resp 18  Ht 5\' 2"  (1.575 m)  Wt 122 lb 11.2 oz (55.656 kg)  BMI 22.44 kg/m2  SpO2 98%  LMP 09/10/2011 I/O last 3 completed shifts: In: 6325.4 [P.O.:1860; I.V.:3515.4; IV Piggyback:950] Out: 1600 [Urine:1600] Total I/O In: 125 [I.V.:125] Out: 0   Physical Exam:  Gen: alert and oriented Chest/Lungs: cta bilaterally  Heart/Pulse: RRR  Abdomen: soft, gravid, nontender Uterine fundus: soft, nontender EXT: negative Homan's b/l  FHT:  Episodes of cat 1 otherwise reassuring at 30wks UC:   Rare now SVE:   Dilation: 3 Effacement (%): 70 Station: -2 Exam by:: dr Su Hilt bullotable (vertex on u/s 04/09/12)  Labs: Lab Results  Component Value Date   WBC 9.3 04/09/2012   HGB 10.7* 04/09/2012   HCT 33.1* 04/09/2012   MCV 88.7 04/09/2012   PLT 222 04/09/2012    Assessment and Plan: 30 3/7wks admitted in PTL.  Pt wants to go home.  Her cervix is unchanged.  I rec bedrest and pelvic rest.  Pt is agreeable.  Her appt is next week in the office.  Precautions reviewed.  Pt may actually transfer her care back to [facility] where she has more support (her mom and rest of her family live there).  Fetal status is overall reassuring.   Purcell Nails 04/10/2012, 2:13 PM

## 2012-04-10 NOTE — Discharge Summary (Addendum)
Obstetric Discharge Summary Reason for Admission: PTL Prenatal Procedures: NST and ultrasound Intrapartum Procedures: n/a Postpartum Procedures: n/a Complications-Operative and Postpartum: n/a Hemoglobin  Date Value Range Status  04/09/2012 10.7* 12.0 - 15.0 g/dL Final     HCT  Date Value Range Status  04/09/2012 33.1* 36.0 - 46.0 % Final    Physical Exam:  General: alert Lochia: n/a Uterine Fundus: firm, gravid Incision: n/a DVT Evaluation: No evidence of DVT seen on physical exam.  Discharge Diagnoses: PTL and ?Ambiguos Genitalia of the Fetus  Discharge Information: Date: 04/10/2012 Activity: pelvic rest and bedrest Diet: routine Medications: None Condition: stable Instructions: PTL precautions discussed with patient.  She will f/u ?ambiguous genitalia of fetus as outpt.  Pt having blood drawn by MFM for gender determination prior to discharge.  MFM rec growth u/s in 3wks.  Discharge to: home Follow-up Information    Follow up with [facility]. (next week in office as scheduled)           Laia Wiley Y 04/10/2012, 2:23 PM

## 2012-04-21 ENCOUNTER — Encounter: Payer: Self-pay | Admitting: Obstetrics and Gynecology

## 2012-04-22 ENCOUNTER — Ambulatory Visit (INDEPENDENT_AMBULATORY_CARE_PROVIDER_SITE_OTHER): Payer: Medicaid Other

## 2012-04-22 VITALS — BP 116/62 | Wt 131.5 lb

## 2012-04-22 DIAGNOSIS — O3430 Maternal care for cervical incompetence, unspecified trimester: Secondary | ICD-10-CM

## 2012-04-22 DIAGNOSIS — O358XX0 Maternal care for other (suspected) fetal abnormality and damage, not applicable or unspecified: Secondary | ICD-10-CM

## 2012-04-22 NOTE — Progress Notes (Signed)
No complaints

## 2012-04-22 NOTE — Progress Notes (Signed)
[redacted]w[redacted]d; 2-3ctxs/hr most of the day.  On BR & pelvic rest since d/c from hospital 7/25.  No Rx for prn Procardia.  FFN not sent, but will defer at present b/c will not change POC.  Received BMZ.  GFM.  Rev'd PTL precautions.  S<D and growth u/s scheduled for next week per MFM note from hospitalization for [facility] lag.   Support given and pt to f/u prn any concerns.  Disc'd weekly visits to continue and cervical check prn--deferred today.

## 2012-04-24 ENCOUNTER — Telehealth (HOSPITAL_COMMUNITY): Payer: Self-pay | Admitting: MS"

## 2012-04-24 NOTE — Telephone Encounter (Signed)
Called Clinton Gallant to discuss her Harmony, cell free fetal DNA testing to obtain information about fetal gender. We discussed that the testing indicates >99% probability for female (XX) fetus. Ms. Spieth asked if this meant there was still a concern that the baby could have "both girl and boy parts." Explained that the concern wasn't necessarily for the baby to have both parts but more for a female baby to have too small parts or a girl baby that might have too big parts. Also discussed that per review of ultrasound images from 04/09/12 ultrasound by Dr. Sherrie George, female external genitalia appeared prominent but may be normal. Reviewed with Ms. Coppock that it may not be clear until the baby is evaluated postnatally. Patient asked what could cause a girl to have enlarged girls parts. Explained that there are possible underlying genetic syndromes that can cause enlarged female genitalia and that genetic evaluation or testing may be performed postnatally, if warranted. Reviewed that newborn screening, performed before the baby leaves the hospital, assess for some genetic conditions, including one that could affect the development of female genitalia: congenital adrenal hyperplasia.   Additionally, these are within normal limits, showing a less than 1 in 10,000 risk for trisomies 21, 18 and 13.  This testing identifies > 99% of pregnancies with trisomy 21, >97% of pregnancies with trisomy 83, and >80% with trisomy 13; the false positive rate is <0.1% for all conditions.  She understands that this testing does not identify all genetic conditions.  All questions were answered to her satisfaction, she was encouraged to call with additional questions or concerns.  Quinn Plowman, MS Patent attorney

## 2012-04-29 ENCOUNTER — Ambulatory Visit (INDEPENDENT_AMBULATORY_CARE_PROVIDER_SITE_OTHER): Payer: Medicaid Other

## 2012-04-29 ENCOUNTER — Ambulatory Visit (INDEPENDENT_AMBULATORY_CARE_PROVIDER_SITE_OTHER): Payer: Medicaid Other | Admitting: Obstetrics and Gynecology

## 2012-04-29 DIAGNOSIS — IMO0002 Reserved for concepts with insufficient information to code with codable children: Secondary | ICD-10-CM

## 2012-04-29 DIAGNOSIS — O3690X0 Maternal care for fetal problem, unspecified, unspecified trimester, not applicable or unspecified: Secondary | ICD-10-CM

## 2012-04-29 DIAGNOSIS — O358XX0 Maternal care for other (suspected) fetal abnormality and damage, not applicable or unspecified: Secondary | ICD-10-CM

## 2012-04-29 DIAGNOSIS — O365990 Maternal care for other known or suspected poor fetal growth, unspecified trimester, not applicable or unspecified: Secondary | ICD-10-CM

## 2012-04-29 NOTE — Progress Notes (Signed)
[redacted]w[redacted]d The patient has been resting at home.  She has a history of preterm labor and a small for gestational age infant. Ultrasound: Single gestation, vertex, normal fluid, cervix 1.83 cm, 31 weeks and 4 days (5.8 percentile), normal Dopplers. Continue resting. Return office in one week.  Doppler studies and biophysical profile next week. Harmony test negative. Dr. Stefano Gaul

## 2012-04-29 NOTE — Progress Notes (Signed)
Pt stated no issues today.  

## 2012-04-30 LAB — US OB FOLLOW UP

## 2012-05-05 ENCOUNTER — Encounter: Payer: Self-pay | Admitting: Obstetrics and Gynecology

## 2012-05-05 ENCOUNTER — Ambulatory Visit (INDEPENDENT_AMBULATORY_CARE_PROVIDER_SITE_OTHER): Payer: Medicaid Other

## 2012-05-05 ENCOUNTER — Other Ambulatory Visit: Payer: Self-pay | Admitting: Emergency Medicine

## 2012-05-05 ENCOUNTER — Ambulatory Visit (INDEPENDENT_AMBULATORY_CARE_PROVIDER_SITE_OTHER): Payer: Medicaid Other | Admitting: Obstetrics and Gynecology

## 2012-05-05 VITALS — BP 90/70 | Wt 134.0 lb

## 2012-05-05 DIAGNOSIS — O365990 Maternal care for other known or suspected poor fetal growth, unspecified trimester, not applicable or unspecified: Secondary | ICD-10-CM

## 2012-05-05 DIAGNOSIS — IMO0002 Reserved for concepts with insufficient information to code with codable children: Secondary | ICD-10-CM

## 2012-05-05 NOTE — Progress Notes (Signed)
No complaints per pt

## 2012-05-05 NOTE — Progress Notes (Signed)
Doing well.  Female fetal gender by [facility] testing. [place] today:  Vtx, AFI 14.45, 50%ile. Cervical length stable at 1.55 BPP 8/8 Dopplers WNL. Has [place] scheduled NV for growth, fluid, BPP, and dopplers due to growth lag. Continues on BR at home.  [place] for BPP, AFI, dopplers NV.

## 2012-05-06 LAB — US OB FOLLOW UP

## 2012-05-08 ENCOUNTER — Other Ambulatory Visit: Payer: Self-pay | Admitting: Obstetrics and Gynecology

## 2012-05-08 DIAGNOSIS — IMO0002 Reserved for concepts with insufficient information to code with codable children: Secondary | ICD-10-CM

## 2012-05-13 ENCOUNTER — Other Ambulatory Visit: Payer: Self-pay | Admitting: Obstetrics and Gynecology

## 2012-05-13 ENCOUNTER — Ambulatory Visit (INDEPENDENT_AMBULATORY_CARE_PROVIDER_SITE_OTHER): Payer: Medicaid Other

## 2012-05-13 ENCOUNTER — Ambulatory Visit (INDEPENDENT_AMBULATORY_CARE_PROVIDER_SITE_OTHER): Payer: Medicaid Other | Admitting: Obstetrics and Gynecology

## 2012-05-13 VITALS — BP 110/60 | Wt 134.0 lb

## 2012-05-13 DIAGNOSIS — IMO0002 Reserved for concepts with insufficient information to code with codable children: Secondary | ICD-10-CM

## 2012-05-13 DIAGNOSIS — O365990 Maternal care for other known or suspected poor fetal growth, unspecified trimester, not applicable or unspecified: Secondary | ICD-10-CM

## 2012-05-13 DIAGNOSIS — Z331 Pregnant state, incidental: Secondary | ICD-10-CM

## 2012-05-13 NOTE — Progress Notes (Signed)
[redacted]w[redacted]d Sono: EFW  4 lbs 10 oz   21%           AFI normal    BPP 8/8   Dopplers normal           Vertex

## 2012-05-13 NOTE — Progress Notes (Signed)
[redacted]w[redacted]d Pt c/o increased abd pain and contractions(unable to time contractions) x 2 days.

## 2012-05-13 NOTE — Progress Notes (Deleted)
sono for growth:   EFW: 4lb 10oz +/- 6oz  65th%tile  AFI: 15.6 cm   BPP: 8/8   Vertex presentation.  Posterior placenta.  Normal fluid.  AFI = 55th%tile.   EFW = 23 rd%tile.  Baby is symentric. Correlate with previous study for interval weight/growth.   Normal umbilical dopplers.  S/D ratio 2.56 (mean for 35 weeks = 2.56) no loss of diastolic flow.  No flow reversal.  Cx not measured.

## 2012-05-14 LAB — US OB FOLLOW UP

## 2012-05-22 ENCOUNTER — Other Ambulatory Visit: Payer: Self-pay | Admitting: Obstetrics and Gynecology

## 2012-05-22 ENCOUNTER — Encounter: Payer: Medicaid Other | Admitting: Obstetrics and Gynecology

## 2012-05-22 ENCOUNTER — Other Ambulatory Visit: Payer: Medicaid Other

## 2012-05-22 DIAGNOSIS — O26849 Uterine size-date discrepancy, unspecified trimester: Secondary | ICD-10-CM

## 2012-06-05 ENCOUNTER — Inpatient Hospital Stay (HOSPITAL_COMMUNITY)
Admission: AD | Admit: 2012-06-05 | Discharge: 2012-06-07 | DRG: 775 | Disposition: A | Discharge status: Home / Self Care | Payer: Medicaid Other | Source: Ambulatory Visit | Attending: Obstetrics and Gynecology | Admitting: Obstetrics and Gynecology

## 2012-06-05 ENCOUNTER — Encounter (HOSPITAL_COMMUNITY): Payer: Self-pay | Admitting: *Deleted

## 2012-06-05 DIAGNOSIS — Z22330 Carrier of Group B streptococcus: Secondary | ICD-10-CM

## 2012-06-05 DIAGNOSIS — F419 Anxiety disorder, unspecified: Secondary | ICD-10-CM | POA: Diagnosis present

## 2012-06-05 DIAGNOSIS — O359XX0 Maternal care for (suspected) fetal abnormality and damage, unspecified, not applicable or unspecified: Secondary | ICD-10-CM

## 2012-06-05 DIAGNOSIS — O9903 Anemia complicating the puerperium: Secondary | ICD-10-CM | POA: Diagnosis not present

## 2012-06-05 DIAGNOSIS — D649 Anemia, unspecified: Secondary | ICD-10-CM | POA: Diagnosis not present

## 2012-06-05 DIAGNOSIS — O0930 Supervision of pregnancy with insufficient antenatal care, unspecified trimester: Secondary | ICD-10-CM

## 2012-06-05 DIAGNOSIS — O09299 Supervision of pregnancy with other poor reproductive or obstetric history, unspecified trimester: Secondary | ICD-10-CM

## 2012-06-05 DIAGNOSIS — O99892 Other specified diseases and conditions complicating childbirth: Secondary | ICD-10-CM | POA: Diagnosis present

## 2012-06-05 DIAGNOSIS — O328XX0 Maternal care for other malpresentation of fetus, not applicable or unspecified: Secondary | ICD-10-CM | POA: Diagnosis present

## 2012-06-05 DIAGNOSIS — O365990 Maternal care for other known or suspected poor fetal growth, unspecified trimester, not applicable or unspecified: Secondary | ICD-10-CM | POA: Diagnosis present

## 2012-06-05 LAB — CBC
HCT: 33.6 % — ABNORMAL LOW (ref 36.0–46.0)
Platelets: 244 10*3/uL (ref 150–400)
RBC: 3.94 MIL/uL (ref 3.87–5.11)
RDW: 14.6 % (ref 11.5–15.5)
WBC: 8.6 10*3/uL (ref 4.0–10.5)

## 2012-06-05 MED ORDER — INFLUENZA VIRUS VACC SPLIT PF IM SUSP
0.5000 mL | INTRAMUSCULAR | Status: AC
  Administered 2012-06-06: 0.5 mL via INTRAMUSCULAR
  Filled 2012-06-05 (×2): qty 0.5

## 2012-06-05 MED ORDER — ZOLPIDEM TARTRATE 5 MG PO TABS: 5.0000 mg | ORAL_TABLET | Freq: Every evening | ORAL | Status: DC | PRN

## 2012-06-05 MED ORDER — MEASLES, MUMPS & RUBELLA VAC ~~LOC~~ INJ
0.5000 mL | INJECTION | Freq: Once | SUBCUTANEOUS | Status: DC
  Filled 2012-06-05: qty 0.5

## 2012-06-05 MED ORDER — OXYTOCIN 40 UNITS IN LACTATED RINGERS INFUSION - SIMPLE MED: 62.5000 mL/h | Freq: Once | INTRAVENOUS | Status: DC

## 2012-06-05 MED ORDER — SIMETHICONE 80 MG PO CHEW: 80.0000 mg | CHEWABLE_TABLET | ORAL | Status: DC | PRN

## 2012-06-05 MED ORDER — ONDANSETRON HCL 4 MG/2ML IJ SOLN: 4.0000 mg | INTRAMUSCULAR | Status: DC | PRN

## 2012-06-05 MED ORDER — LACTATED RINGERS IV SOLN
INTRAVENOUS | Status: DC
  Administered 2012-06-05: 04:00:00 via INTRAVENOUS

## 2012-06-05 MED ORDER — LANOLIN HYDROUS EX OINT: TOPICAL_OINTMENT | CUTANEOUS | Status: DC | PRN

## 2012-06-05 MED ORDER — ONDANSETRON HCL 4 MG PO TABS: 4.0000 mg | ORAL_TABLET | ORAL | Status: DC | PRN

## 2012-06-05 MED ORDER — WITCH HAZEL-GLYCERIN EX PADS: 1.0000 "application " | MEDICATED_PAD | CUTANEOUS | Status: DC | PRN

## 2012-06-05 MED ORDER — OXYTOCIN 10 UNIT/ML IJ SOLN
10.0000 [IU] | Freq: Once | INTRAMUSCULAR | Status: DC
  Administered 2012-06-05: 10 [IU] via INTRAMUSCULAR
  Filled 2012-06-05: qty 1

## 2012-06-05 MED ORDER — ONDANSETRON HCL 4 MG/2ML IJ SOLN: 4.0000 mg | Freq: Four times a day (QID) | INTRAMUSCULAR | Status: DC | PRN

## 2012-06-05 MED ORDER — OXYCODONE-ACETAMINOPHEN 5-325 MG PO TABS: 1.0000 | ORAL_TABLET | ORAL | Status: DC | PRN

## 2012-06-05 MED ORDER — DIPHENHYDRAMINE HCL 25 MG PO CAPS: 25.0000 mg | ORAL_CAPSULE | Freq: Four times a day (QID) | ORAL | Status: DC | PRN

## 2012-06-05 MED ORDER — DOCUSATE SODIUM 100 MG PO CAPS
100.0000 mg | ORAL_CAPSULE | Freq: Two times a day (BID) | ORAL | Status: DC
  Administered 2012-06-05 – 2012-06-07 (×5): 100 mg via ORAL
  Filled 2012-06-05 (×5): qty 1

## 2012-06-05 MED ORDER — IBUPROFEN 600 MG PO TABS
600.0000 mg | ORAL_TABLET | Freq: Four times a day (QID) | ORAL | Status: DC
  Administered 2012-06-05 – 2012-06-07 (×9): 600 mg via ORAL
  Filled 2012-06-05 (×9): qty 1

## 2012-06-05 MED ORDER — TETANUS-DIPHTH-ACELL PERTUSSIS 5-2.5-18.5 LF-MCG/0.5 IM SUSP: 0.5000 mL | Freq: Once | INTRAMUSCULAR | Status: DC

## 2012-06-05 MED ORDER — FERROUS SULFATE 325 (65 FE) MG PO TABS
325.0000 mg | ORAL_TABLET | Freq: Two times a day (BID) | ORAL | Status: DC
  Administered 2012-06-05 – 2012-06-07 (×4): 325 mg via ORAL
  Filled 2012-06-05 (×4): qty 1

## 2012-06-05 MED ORDER — IBUPROFEN 600 MG PO TABS
600.0000 mg | ORAL_TABLET | Freq: Four times a day (QID) | ORAL | Status: DC | PRN
  Administered 2012-06-05: 600 mg via ORAL
  Filled 2012-06-05: qty 1

## 2012-06-05 MED ORDER — LACTATED RINGERS IV SOLN: 500.0000 mL | INTRAVENOUS | Status: DC | PRN

## 2012-06-05 MED ORDER — ACETAMINOPHEN 325 MG PO TABS: 650.0000 mg | ORAL_TABLET | ORAL | Status: DC | PRN

## 2012-06-05 MED ORDER — DIBUCAINE 1 % RE OINT: 1.0000 "application " | TOPICAL_OINTMENT | RECTAL | Status: DC | PRN

## 2012-06-05 MED ORDER — OXYCODONE-ACETAMINOPHEN 5-325 MG PO TABS
1.0000 | ORAL_TABLET | ORAL | Status: DC | PRN
  Administered 2012-06-05 (×2): 1 via ORAL
  Filled 2012-06-05 (×2): qty 1

## 2012-06-05 MED ORDER — CITRIC ACID-SODIUM CITRATE 334-500 MG/5ML PO SOLN: 30.0000 mL | ORAL | Status: DC | PRN

## 2012-06-05 MED ORDER — SODIUM CHLORIDE 0.9 % IV SOLN
2.0000 g | Freq: Once | INTRAVENOUS | Status: DC
  Filled 2012-06-05: qty 2000

## 2012-06-05 MED ORDER — OXYTOCIN BOLUS FROM INFUSION
500.0000 mL | Freq: Once | INTRAVENOUS | Status: DC
  Filled 2012-06-05: qty 500

## 2012-06-05 MED ORDER — PRENATAL MULTIVITAMIN CH
1.0000 | ORAL_TABLET | Freq: Every day | ORAL | Status: DC
  Administered 2012-06-05 – 2012-06-07 (×3): 1 via ORAL
  Filled 2012-06-05 (×3): qty 1

## 2012-06-05 MED ORDER — LIDOCAINE HCL (PF) 1 % IJ SOLN
30.0000 mL | INTRAMUSCULAR | Status: DC | PRN
  Administered 2012-06-05: 30 mL via SUBCUTANEOUS
  Filled 2012-06-05: qty 30

## 2012-06-05 MED ORDER — SENNOSIDES-DOCUSATE SODIUM 8.6-50 MG PO TABS
2.0000 | ORAL_TABLET | Freq: Every day | ORAL | Status: DC
  Administered 2012-06-05 – 2012-06-06 (×2): 2 via ORAL

## 2012-06-05 MED ORDER — BENZOCAINE-MENTHOL 20-0.5 % EX AERO
1.0000 "application " | INHALATION_SPRAY | CUTANEOUS | Status: DC | PRN
  Administered 2012-06-05: 1 via TOPICAL
  Filled 2012-06-05: qty 56

## 2012-06-05 MED ORDER — MISOPROSTOL 200 MCG PO TABS
ORAL_TABLET | ORAL | Status: AC
  Administered 2012-06-05: 1000 ug
  Filled 2012-06-05: qty 5

## 2012-06-05 NOTE — Progress Notes (Signed)

## 2012-06-05 NOTE — H&P (Signed)
Tracy Hanna is a 21 y.o. female G2P1 at [redacted]w[redacted]d presenting for painful ctx, denies LOF, reports some bloody show, GFM.  cx 9cm, BBOW  HPI: Pt began PNC at 27wks at [facility], She was tx'd for +GBS bacteruria, and TOC was neg. At 30wks she began having PTL and was admitted to antepartum, rcv'd mag sulfate and BMZ course. Pt had f/u US at 33wks that identified SGA infant, she rcv'd genetic counseling secondary to US showing ambiguous genitalia, although harmony screen was WNL. Most recent visit was at 35wks, EFW at that time was 21%.   Maternal Medical History:  Reason for admission: Reason for admission: contractions.  Contractions: Onset was 6-12 hours ago.   Frequency: regular.   Duration is approximately 60 seconds.   Perceived severity is strong.    Fetal activity: Perceived fetal activity is normal.   Last perceived fetal movement was within the past hour.    Prenatal complications: Preterm labor.     OB History    Grav Para Term Preterm Abortions TAB SAB Ect Mult Living   2 2 2       2      Past Medical History  Diagnosis Date  . Anemia   . Ovarian cyst     with current pregnancy   . Anxiety AGE 21    NO MEDS CURRENTLY  . Infection     UTI WITH PREGNANCY  . Infection 2008    TRICH  . Infection     OCC yeast  . Premature dilatation of cervix during pregnancy 04/09/2012    3 cm   Past Surgical History  Procedure Date  . No past surgeries    Family History: family history includes Arthritis in her maternal grandfather and maternal grandmother; Asthma in her brother, father, maternal aunt, and sister; Diabetes in her maternal grandmother; Early death (age of onset:5) in her sister; and Hypertension in her father, maternal grandmother, and mother.  There is no history of Other. Social History:  reports that she has never smoked. She has never used smokeless tobacco. She reports that she does not drink alcohol or use illicit drugs.   Prenatal Transfer Tool  Maternal  Diabetes: No Genetic Screening: Normal Maternal Ultrasounds/Referrals: Abnormal:  Findings:   Other: ?ambiguous genitalia, harmony screen xx  Fetal Ultrasounds or other Referrals:  None Maternal Substance Abuse:  No Significant Maternal Medications:  None Significant Maternal Lab Results:  GBS pos  Other Comments:  limited PNC   Review of Systems  All other systems reviewed and are negative.    Dilation: 10 Effacement (%): 100 Station: +1 Exam by:: s. Rosilyn Coachman, cnm, Blood pressure 119/87, pulse 96, temperature 98.6 F (37 C), temperature source Oral, resp. rate 18, height 5\' 5"  (1.651 m), weight 137 lb (62.143 kg), last menstrual period 09/10/2011, unknown if currently breastfeeding. Maternal Exam:  Uterine Assessment: Contraction strength is firm.  Contraction duration is 60 seconds. Contraction frequency is regular.   Abdomen: Patient reports no abdominal tenderness. Fundal height is aga.   Estimated fetal weight is 5-6.   Fetal presentation: vertex  Introitus: Normal vulva. Normal vagina.  Pelvis: adequate for delivery.   Cervix: Cervix evaluated by digital exam.     Fetal Exam Fetal Monitor Review: Mode: ultrasound.   Baseline rate: 140.  Variability: moderate (6-25 bpm).   Pattern: no decelerations and variable decelerations.    Fetal State Assessment: Category II - tracings are indeterminate.     Physical Exam  Nursing note and vitals reviewed.  Constitutional: She is oriented to person, place, and time. She appears well-developed and well-nourished. She appears distressed.  HENT:  Head: Normocephalic.  Eyes: Pupils are equal, round, and reactive to light.  Neck: Normal range of motion.  Cardiovascular: Normal rate, regular rhythm and normal heart sounds.   GI: Soft. Bowel sounds are normal.  Genitourinary: Vagina normal.       Normal bloody show  Musculoskeletal: Normal range of motion. She exhibits no edema.  Neurological: She is alert and oriented to  person, place, and time.  Skin: Skin is warm and dry.  Psychiatric: She has a normal mood and affect. Her behavior is normal.    Prenatal labs: ABO, Rh: A/POS/-- (06/28 1122) Antibody: NEG (06/28 1122) Rubella: 149.4 (06/28 1122) RPR: NON REAC (07/02 1539)  HBsAg: NEGATIVE (06/28 1122)  HIV: NON REACTIVE (06/28 1122)  GBS: Positive (07/24 0000) in urine Pap neg GC/CT neg hgb electrophoresis neg Harmony screen WNL - XY 1hr gtt WNL, hgb 10.8, RPR NR    Assessment/Plan: IUP at [redacted]w[redacted]d Active labor GBS pos FHR cat 2  Admit to b.s.  Routine L&D orders Ancef 2g for GBS prophylaxis  Dr Su Hilt notified    Sanda Klein M 06/05/2012, 6:49 AM

## 2012-06-05 NOTE — Progress Notes (Signed)
Post Partum Day 1 Subjective: No complaints, up ad lib without syncope, voiding, tolerating PO, + flatus  Pain well controlled with po meds BF: had commenced but will need to see the Lactation Specialist today Mood stable, bonding well Contraception: Mirena IUD   Objective: Blood pressure 109/59, pulse 87, temperature 98.8 F (37.1 C), temperature source Oral, resp. rate 18, height 5\' 5"  (1.651 m), weight 137 lb (62.143 kg), last menstrual period 09/10/2011, unknown if currently breastfeeding.  Physical Exam:  General: alert, cooperative and no distress Lungs: CTAB Heart: RRR Breasts:N/T Lochia: appropriate Uterine Fundus: firm Perineum: DVT Evaluation: No evidence of DVT seen on physical exam. Negative Homan's sign. Postpartum Anemia: to start on Ferrous Sulfate 325 mgs po BID and Colace 100 mgs po BID           Basename 06/05/12 0409  HGB 10.8*  HCT 33.6*    Assessment/Plan: Plan for discharge tomorrow      LOS: 0 days   Ladonte Verstraete, CNM. 06/05/2012, 10:10 AM

## 2012-06-05 NOTE — MAU Note (Signed)
Pt reports having "intense contractions" about 20 min apart. Reports mucusy discharge.

## 2012-06-05 NOTE — Progress Notes (Signed)
UR chart review completed.  

## 2012-06-06 DIAGNOSIS — O0930 Supervision of pregnancy with insufficient antenatal care, unspecified trimester: Secondary | ICD-10-CM

## 2012-06-06 LAB — CBC
MCHC: 31.9 g/dL (ref 30.0–36.0)
RDW: 14.7 % (ref 11.5–15.5)

## 2012-06-06 NOTE — Progress Notes (Signed)
S: comfortable, little bleeding, slept     breastfeeding O VSS     abd soft, nt, ff      sm  Flow perineum clean intact     -Homans sign bilaterally,       No edema     Hemoglobin & Hematocrit     Component Value Date/Time   HGB 10.3* 06/06/2012 0530   HCT 32.3* 06/06/2012 0530   A normal involution     Lactating     PP day 1 P plans mirena IUDcontinue care Lavera Guise, CNM

## 2012-06-07 MED ORDER — IBUPROFEN 600 MG PO TABS: 600.0000 mg | ORAL_TABLET | Freq: Four times a day (QID) | ORAL | Status: DC | PRN

## 2012-06-07 NOTE — Discharge Summary (Signed)
Physician Discharge Summary  Patient ID: Tracy Hanna MRN: [id number] DOB/AGE: [birth year] 21 y.o.  Admit date: 06/05/2012 Discharge date: 06/07/2012  Admission Diagnoses: [redacted]w[redacted]d 10 cm with rapid delivery Antenatal course:PNC at 27wks at [facility], She was txPNC at 27wks at [facility], She was tx'd for +GBS bacteruria, and TOC was neg. At 30wks she began having PTL and was admitted to antepartum, rcv'd mag sulfate and BMZ course. Pt had f/u US at 33wks that identified SGA infant, she rcv'd genetic counseling secondary to US showing ambiguous genitalia, although harmony screen was WNL 'd for +GBS bacteruria, and TOC was neg. At 30wks she began having PTL and was admitted to antepartum, rcv'd mag sulfate and BMZ course. Pt had f/u US at 33wks that identified SGA infant, she rcv'd genetic counseling secondary to US showing ambiguous genitalia, although harmony screen was WNL    Discharge Diagnoses:  Active Problems:  Anxiety  NSVD (normal spontaneous vaginal delivery)   Discharged Condition: stable PNC at 27wks at [facility], She was tx'd for +GBS bacteruria, and TOC was neg. At 30wks she began having PTL and was admitted to antepartum, rcv'd mag sulfate and BMZ course. Pt had f/u US at 33wks that identified SGA infant, she rcv'd genetic counseling secondary to US showing ambiguous genitalia, although harmony screen was [facility]  Hospital Course: [facility] at 27wks at [facility], She was tx'd for +GBS bacteruria, and TOC was neg. At 30wks she began having PTL and was admitted to antepartum, rcv'd mag sulfate and BMZ course. Pt had f/u US at 33wks that identified SGA infant, she rcv'd genetic counseling secondary to US showing ambiguous genitalia, although harmony screen was WNL 10 cm on admission, rapid SVD, 1 degree vag lac, normal involution, plan Mirena IUD  Consults: None  Significant Diagnostic Studies: labs:  Hemoglobin & Hematocrit     Component Value Date/Time   HGB 10.3* 06/06/2012 0530   HCT 32.3* 06/06/2012 0530       Treatments: na  Discharge Exam: Blood pressure 105/69, pulse 93, temperature 97.9 F (36.6 C), temperature source Oral, resp. rate 18, height 5\' 5"  (1.651 m), weight 137 lb (62.143 kg), last menstrual period 09/10/2011, unknown if currently breastfeeding. General appearance: alert, cooperative and no distress S: comfortable, little bleeding, slept     breastfeeding O VSS     abd soft, nt, ff      sm  flowperineum clean intact 1 degree lac     -Homans sign bilaterally,      No edema A normal involution     Lactating     PP day 2   Disposition: 01-Home or Self Care  Discharge Orders    Future Orders Please Complete By Expires   OB RESULT CONSOLE Group B Strep      Comments:   This external order was created through the Results Console.       Medication List     As of 06/07/2012 10:06 AM    ASK your doctor about these medications         prenatal multivitamin Tabs   Take 1 tablet by mouth daily.       f/o 5 weeks, plan mirena IUD, [facility] handbook discussed.  SignedLavera Guise 06/07/2012, 10:06 AM

## 2012-07-10 ENCOUNTER — Ambulatory Visit: Payer: Medicaid Other | Admitting: Obstetrics and Gynecology

## 2012-07-19 ENCOUNTER — Encounter (HOSPITAL_COMMUNITY): Payer: Self-pay | Admitting: *Deleted

## 2012-07-19 ENCOUNTER — Emergency Department (HOSPITAL_COMMUNITY)
Admission: EM | Admit: 2012-07-19 | Discharge: 2012-07-20 | Disposition: A | Discharge status: Home / Self Care | Payer: Medicaid Other | Attending: Emergency Medicine | Admitting: Emergency Medicine

## 2012-07-19 DIAGNOSIS — K089 Disorder of teeth and supporting structures, unspecified: Secondary | ICD-10-CM | POA: Insufficient documentation

## 2012-07-19 NOTE — ED Notes (Signed)
Patient is alert and oriented x3.  She is complaining of right side dental pain. She currently rates her pain 10 of 10 that is her right jaw andy neck.  She denies any nausea of vomiting.

## 2012-07-20 NOTE — ED Provider Notes (Signed)
Please see downtime documentation  Otilio Miu, [place] 07/20/12 [phone]

## 2014-07-19 ENCOUNTER — Encounter (HOSPITAL_COMMUNITY): Payer: Self-pay | Admitting: *Deleted

## 2014-08-13 ENCOUNTER — Inpatient Hospital Stay (HOSPITAL_COMMUNITY): Payer: Medicaid Other

## 2014-08-13 ENCOUNTER — Encounter (HOSPITAL_COMMUNITY): Payer: Self-pay | Admitting: *Deleted

## 2014-08-13 ENCOUNTER — Inpatient Hospital Stay (HOSPITAL_COMMUNITY)
Admission: AD | Admit: 2014-08-13 | Discharge: 2014-08-13 | Disposition: A | Discharge status: Home / Self Care | Payer: Medicaid Other | Source: Ambulatory Visit | Attending: Obstetrics & Gynecology | Admitting: Obstetrics & Gynecology

## 2014-08-13 DIAGNOSIS — R109 Unspecified abdominal pain: Secondary | ICD-10-CM | POA: Insufficient documentation

## 2014-08-13 DIAGNOSIS — O468X1 Other antepartum hemorrhage, first trimester: Secondary | ICD-10-CM

## 2014-08-13 DIAGNOSIS — O418X10 Other specified disorders of amniotic fluid and membranes, first trimester, not applicable or unspecified: Secondary | ICD-10-CM

## 2014-08-13 DIAGNOSIS — O209 Hemorrhage in early pregnancy, unspecified: Secondary | ICD-10-CM | POA: Diagnosis present

## 2014-08-13 DIAGNOSIS — Z3A12 12 weeks gestation of pregnancy: Secondary | ICD-10-CM | POA: Diagnosis not present

## 2014-08-13 DIAGNOSIS — O26899 Other specified pregnancy related conditions, unspecified trimester: Secondary | ICD-10-CM

## 2014-08-13 LAB — CBC
HCT: 38.6 % (ref 36.0–46.0)
Hemoglobin: 12.8 g/dL (ref 12.0–15.0)
MCH: 29.2 pg (ref 26.0–34.0)
MCHC: 33.2 g/dL (ref 30.0–36.0)
MCV: 88.1 fL (ref 78.0–100.0)
Platelets: 321 10*3/uL (ref 150–400)
RBC: 4.38 MIL/uL (ref 3.87–5.11)
RDW: 14.8 % (ref 11.5–15.5)
WBC: 8.6 10*3/uL (ref 4.0–10.5)

## 2014-08-13 LAB — URINALYSIS, ROUTINE W REFLEX MICROSCOPIC
BILIRUBIN URINE: NEGATIVE
GLUCOSE, UA: NEGATIVE mg/dL
HGB URINE DIPSTICK: NEGATIVE
KETONES UR: NEGATIVE mg/dL
Leukocytes, UA: NEGATIVE
Nitrite: POSITIVE — AB
PH: 6.5 (ref 5.0–8.0)
Protein, ur: NEGATIVE mg/dL
SPECIFIC GRAVITY, URINE: 1.025 (ref 1.005–1.030)
Urobilinogen, UA: 0.2 mg/dL (ref 0.0–1.0)

## 2014-08-13 LAB — WET PREP, GENITAL
TRICH WET PREP: NONE SEEN
Yeast Wet Prep HPF POC: NONE SEEN

## 2014-08-13 LAB — OB RESULTS CONSOLE GC/CHLAMYDIA
Chlamydia: NEGATIVE
Gonorrhea: NEGATIVE

## 2014-08-13 LAB — URINE MICROSCOPIC-ADD ON

## 2014-08-13 LAB — HCG, QUANTITATIVE, PREGNANCY: hCG, Beta Chain, Quant, S: 155101 m[IU]/mL — ABNORMAL HIGH (ref <5)

## 2014-08-13 MED ORDER — CEPHALEXIN 500 MG PO CAPS: 500.0000 mg | ORAL_CAPSULE | Freq: Four times a day (QID) | ORAL | Status: DC

## 2014-08-13 NOTE — Discharge Instructions (Signed)
Subchorionic Hematoma °A subchorionic hematoma is a gathering of blood between the outer wall of the placenta and the inner wall of the womb (uterus). The placenta is the organ that connects the fetus to the wall of the uterus. The placenta performs the feeding, breathing (oxygen to the fetus), and waste removal (excretory work) of the fetus.  °Subchorionic hematoma is the most common abnormality found on a result from ultrasonography done during the first trimester or early second trimester of pregnancy. If there has been little or no vaginal bleeding, early small hematomas usually shrink on their own and do not affect your baby or pregnancy. The blood is gradually absorbed over 1-2 weeks. When bleeding starts later in pregnancy or the hematoma is larger or occurs in an older pregnant woman, the outcome may not be as good. Larger hematomas may get bigger, which increases the chances for miscarriage. Subchorionic hematoma also increases the risk of premature detachment of the placenta from the uterus, preterm (premature) labor, and stillbirth. °HOME CARE INSTRUCTIONS °· Stay on bed rest if your health care provider recommends this. Although bed rest will not prevent more bleeding or prevent a miscarriage, your health care provider may recommend bed rest until you are advised otherwise. °· Avoid heavy lifting (more than 10 lb [4.5 kg]), exercise, sexual intercourse, or douching as directed by your health care provider. °· Keep track of the number of pads you use each day and how soaked (saturated) they are. Write down this information. °· Do not use tampons. °· Keep all follow-up appointments as directed by your health care provider. Your health care provider may ask you to have follow-up blood tests or ultrasound tests or both. °SEEK IMMEDIATE MEDICAL CARE IF: °· You have severe cramps in your stomach, back, abdomen, or pelvis. °· You have a fever. °· You pass large clots or tissue. Save any tissue for your health  care provider to look at. °· Your bleeding increases or you become lightheaded, feel weak, or have fainting episodes. °Document Released: 12/19/2006 Document Revised: 01/18/2014 Document Reviewed: 04/02/2013 °ExitCare® Patient Information ©2015 [employer]. This information is not intended to replace advice given to you by your health care provider. Make sure you discuss any questions you have with your health care provider. °First Trimester of Pregnancy °The first trimester of pregnancy is from week 1 until the end of week 12 (months 1 through 3). During this time, your baby will begin to develop inside you. At 6-8 weeks, the eyes and face are formed, and the heartbeat can be seen on ultrasound. At the end of 12 weeks, all the baby's organs are formed. Prenatal care is all the medical care you receive before the birth of your baby. Make sure you get good prenatal care and follow all of your doctor's instructions. °HOME CARE  °Medicines °· Take medicine only as told by your doctor. Some medicines are safe and some are not during pregnancy. °· Take your prenatal vitamins as told by your doctor. °· Take medicine that helps you poop (stool softener) as needed if your doctor says it is okay. °Diet °· Eat regular, healthy meals. °· Your doctor will tell you the amount of weight gain that is right for you. °· Avoid raw meat and uncooked cheese. °· If you feel sick to your stomach (nauseous) or throw up (vomit): °· Eat 4 or 5 small meals a day instead of 3 large meals. °· Try eating a few soda crackers. °· Drink liquids between meals   instead of during meals. °· If you have a hard time pooping (constipation): °· Eat high-fiber foods like fresh vegetables, fruit, and whole grains. °· Drink enough fluids to keep your pee (urine) clear or pale yellow. °Activity and Exercise °· Exercise only as told by your doctor. Stop exercising if you have cramps or pain in your lower belly (abdomen) or low back. °· Try to avoid standing  for long periods of time. Move your legs often if you must stand in one place for a long time. °· Avoid heavy lifting. °· Wear low-heeled shoes. Sit and stand up straight. °· You can have sex unless your doctor tells you not to. °Relief of Pain or Discomfort °· Wear a good support bra if your breasts are sore. °· Take warm water baths (sitz baths) to soothe pain or discomfort caused by hemorrhoids. Use hemorrhoid cream if your doctor says it is okay. °· Rest with your legs raised if you have leg cramps or low back pain. °· Wear support hose if you have puffy, bulging veins (varicose veins) in your legs. Raise (elevate) your feet for 15 minutes, 3-4 times a day. Limit salt in your diet. °Prenatal Care °· Schedule your prenatal visits by the twelfth week of pregnancy. °· Write down your questions. Take them to your prenatal visits. °· Keep all your prenatal visits as told by your doctor. °Safety °· Wear your seat belt at all times when driving. °· Make a list of emergency phone numbers. The list should include numbers for family, friends, the hospital, and police and fire departments. °General Tips °· Ask your doctor for a referral to a local prenatal class. Begin classes no later than at the start of month 6 of your pregnancy. °· Ask for help if you need counseling or help with nutrition. Your doctor can give you advice or tell you where to go for help. °· Do not use hot tubs, steam rooms, or saunas. °· Do not douche or use tampons or scented sanitary pads. °· Do not cross your legs for long periods of time. °· Avoid litter boxes and soil used by cats. °· Avoid all smoking, herbs, and alcohol. Avoid drugs not approved by your doctor. °· Visit your dentist. At home, brush your teeth with a soft toothbrush. Be gentle when you floss. °GET HELP IF: °· You are dizzy. °· You have mild cramps or pressure in your lower belly. °· You have a nagging pain in your belly area. °· You continue to feel sick to your stomach, throw  up, or have watery poop (diarrhea). °· You have a bad smelling fluid coming from your vagina. °· You have pain with peeing (urination). °· You have increased puffiness (swelling) in your face, hands, legs, or ankles. °GET HELP RIGHT AWAY IF:  °· You have a fever. °· You are leaking fluid from your vagina. °· You have spotting or bleeding from your vagina. °· You have very bad belly cramping or pain. °· You gain or lose weight rapidly. °· You throw up blood. It may look like coffee grounds. °· You are around people who have German measles, fifth disease, or chickenpox. °· You have a very bad headache. °· You have shortness of breath. °· You have any kind of trauma, such as from a fall or a car accident. °Document Released: 02/20/2008 Document Revised: 01/18/2014 Document Reviewed: 07/14/2013 °ExitCare® Patient Information ©2015 [employer]. This information is not intended to replace advice given to you by your health   care provider. Make sure you discuss any questions you have with your health care provider. ° °

## 2014-08-13 NOTE — MAU Note (Signed)
Didn't come on cycle, has been spotting.  Having really bad pain, cramps in lower abd, got worse today. Has not done home test.

## 2014-08-13 NOTE — MAU Provider Note (Signed)
History     CSN: [id number]  Arrival date and time: 08/13/14 [id number]   First Provider Initiated Contact with Patient 08/13/14 2013      Chief Complaint  Patient presents with  . Abdominal Pain  . Vaginal Bleeding   HPI Tracy Hanna 23 y.o. [id number] @[redacted]w[redacted]d  presents to MAU complaining of vaginal bleeding and cramping starting yesterday.  Her pain is described as sharp and takes a while to ease up.  It has been as much as 5/10 but presently there is no pain.  The bleeding has been spotting, noticed when going to bathroom and is bright red.   She does not fill pads.  Associated symptoms are nausea and vomiting, constipation, weakness.  She denies fever, headache.   OB History    Gravida Para Term Preterm AB TAB SAB Ectopic Multiple Living   3 2 2       2       Past Medical History  Diagnosis Date  . Anemia   . Ovarian cyst     with current pregnancy   . Anxiety AGE 29    NO MEDS CURRENTLY  . Infection     UTI WITH PREGNANCY  . Infection 2008    TRICH  . Infection     OCC yeast  . Premature dilatation of cervix during pregnancy 04/09/2012    3 cm    Past Surgical History  Procedure Laterality Date  . No past surgeries      Family History  Problem Relation Age of Onset  . Other Neg Hx   . Hypertension Mother   . Asthma Father   . Hypertension Father   . Asthma Sister   . Early death Sister 5    ASTHMA  . Asthma Brother   . Asthma Maternal Aunt   . Arthritis Maternal Grandmother   . Diabetes Maternal Grandmother   . Hypertension Maternal Grandmother   . Arthritis Maternal Grandfather     History  Substance Use Topics  . Smoking status: Never Smoker   . Smokeless tobacco: Never Used  . Alcohol Use: No     Comment: OCC    Allergies: No Known Allergies  No prescriptions prior to admission    ROS Pertinent ROS in HPI Physical Exam   Blood pressure 122/68, pulse 78, temperature 98.4 F (36.9 C), temperature source Oral, resp. rate 18, height 5' 3.5"  (1.613 m), weight 137 lb (62.143 kg), last menstrual period 05/20/2014.  Physical Exam  Constitutional: She is oriented to person, place, and time. She appears well-developed and well-nourished.  HENT:  Head: Normocephalic and atraumatic.  Eyes: EOM are normal.  Neck: Normal range of motion.  Cardiovascular: Normal rate.   Respiratory: Effort normal and breath sounds normal. No respiratory distress.  GI: Soft. Bowel sounds are normal.  Genitourinary:  Large amt of yellowish mucus in vagina and at cervical os.  No erythema of os noted.  Cervix is closed.  No CMT.  No adnexal pain or tenderness.  Musculoskeletal: Normal range of motion.  Neurological: She is alert and oriented to person, place, and time.  Skin: Skin is warm and dry.  Psychiatric: She has a normal mood and affect.   Results for orders placed or performed during the hospital encounter of 08/13/14 (from the past 24 hour(s))  Urinalysis, Routine w reflex microscopic     Status: Abnormal   Collection Time: 08/13/14  6:55 PM  Result Value Ref Range   Color, Urine YELLOW YELLOW  APPearance HAZY (A) CLEAR   Specific Gravity, Urine 1.025 1.005 - 1.030   pH 6.5 5.0 - 8.0   Glucose, UA NEGATIVE NEGATIVE mg/dL   Hgb urine dipstick NEGATIVE NEGATIVE   Bilirubin Urine NEGATIVE NEGATIVE   Ketones, ur NEGATIVE NEGATIVE mg/dL   Protein, ur NEGATIVE NEGATIVE mg/dL   Urobilinogen, UA 0.2 0.0 - 1.0 mg/dL   Nitrite POSITIVE (A) NEGATIVE   Leukocytes, UA NEGATIVE NEGATIVE  Urine microscopic-add on     Status: Abnormal   Collection Time: 08/13/14  6:55 PM  Result Value Ref Range   Squamous Epithelial / LPF FEW (A) RARE   WBC, UA 0-2 <3 WBC/hpf   Bacteria, UA MANY (A) RARE  CBC     Status: None   Collection Time: 08/13/14  8:15 PM  Result Value Ref Range   WBC 8.6 4.0 - 10.5 K/uL   RBC 4.38 3.87 - 5.11 MIL/uL   Hemoglobin 12.8 12.0 - 15.0 g/dL   HCT 09.838.6 11.936.0 - 14.746.0 %   MCV 88.1 78.0 - 100.0 fL   MCH 29.2 26.0 - 34.0 pg    MCHC 33.2 30.0 - 36.0 g/dL   RDW 82.914.8 56.211.5 - 13.015.5 %   Platelets 321 150 - 400 K/uL  hCG, quantitative, pregnancy     Status: Abnormal   Collection Time: 08/13/14  8:15 PM  Result Value Ref Range   hCG, Beta Chain, Quant, S 155101 (H) <5 mIU/mL  Wet prep, genital     Status: Abnormal   Collection Time: 08/13/14  8:37 PM  Result Value Ref Range   Yeast Wet Prep HPF POC NONE SEEN NONE SEEN   Trich, Wet Prep NONE SEEN NONE SEEN   Clue Cells Wet Prep HPF POC FEW (A) NONE SEEN   WBC, Wet Prep HPF POC MODERATE (A) NONE SEEN   [place] Ob Comp Less 14 Wks  08/13/2014   ADDENDUM REPORT: 08/13/2014 22:17  ADDENDUM: This exam was transabdominal only. Transvaginal component was canceled.   Electronically Signed   By: Roque LiasJames  Green M.D.   On: 08/13/2014 22:17   08/13/2014   CLINICAL DATA:  Vaginal bleeding and pregnancy, first trimester.  EXAM: OBSTETRIC <14 WK US AND TRANSVAGINAL OB US  TECHNIQUE: Both transabdominal and transvaginal ultrasound examinations were performed for complete evaluation of the gestation as well as the maternal uterus, adnexal regions, and pelvic cul-de-sac. Transvaginal technique was performed to assess early pregnancy.  COMPARISON:  None.  FINDINGS: Intrauterine gestational sac: Visualized/normal in shape.  Yolk sac:  Visualized.  Embryo:  Visualized.  Cardiac Activity: Visualized.  Heart Rate:  174 bpm  CRL:   17.5   mm   8 w 2 d                  US EDC: March 23, 2015.  Maternal uterus/adnexae: Both ovaries appear normal, with corpus luteum cyst seen in left ovary. No free fluid is noted. Small subchorionic hemorrhage is noted.  IMPRESSION: Single live intrauterine gestation of 8 weeks 2 days. Small subchorionic hemorrhage is noted.  Electronically Signed: By: Roque LiasJames  Green M.D. On: 08/13/2014 21:59    MAU Course  Procedures  MDM Pt not in any discomfort.  NO blood noted on vaginal exam.  Awaiting ultrasound and lab results.  Report given and care turned over to Vonzella NippleJulie Celise Bazar, PA-C at  9:00pm.  Assessment and Plan  A:   SIUP at 1732w2d with normal cardiac activity UTI in pregnancy Small subchorionic hemorrhage  P:  Discharge to home Bleeding precautions and first trimester warning signs discussed Rx for Keflex sent to patient's pharmacy Patient given pregnancy confirmation letter Patient plans to go to [facility] for pregnancy. Advised to call for appointment to start prenatal care Patient may return to MAU as needed or if her condition were to change or worsen   Marny Lowenstein, PA-C  08/13/2014, 10:43 PM

## 2014-08-14 LAB — GC/CHLAMYDIA PROBE AMP
CT PROBE, AMP APTIMA: NEGATIVE
GC PROBE AMP APTIMA: NEGATIVE

## 2014-08-14 LAB — POCT PREGNANCY, URINE: Preg Test, Ur: POSITIVE — AB

## 2014-08-17 LAB — CULTURE, OB URINE

## 2014-08-18 ENCOUNTER — Telehealth (HOSPITAL_COMMUNITY): Payer: Self-pay | Admitting: Obstetrics and Gynecology

## 2014-08-18 MED ORDER — AMOXICILLIN 500 MG PO CAPS
500.0000 mg | ORAL_CAPSULE | Freq: Three times a day (TID) | ORAL | Status: DC
Start: 2014-08-18 — End: 2014-10-22

## 2014-08-18 NOTE — Telephone Encounter (Signed)
Patient returned call to MAU. Patient had +GBS in urine; RX for amoxicillin sent to her pharmacy. Allergies confirmed

## 2014-09-17 NOTE — L&D Delivery Note (Cosign Needed)
Patient is 24 y.o. [id number] [redacted]w[redacted]d admitted on 02/23/15 with preterm labor with uncomplicated prenatal course.    Delivery Note At 8:14 AM a viable female was delivered via Vaginal, Spontaneous Delivery (Presentation: Left Occiput Anterior).  APGAR: 8, 9; weight  .   Placenta status: Intact, Spontaneous.  Cord: 3 vessels with the following complications: None.    Anesthesia: Epidural  Episiotomy: None Lacerations: None Suture Repair: none Est. Blood Loss (mL): 68  Mom to postpartum.  Baby to Couplet care / Skin to Skin.  Fabio Asa 02/24/2015, 8:44 AM   Birth supervised by me.  Cheral Marker, CNM, [facility] 02/28/2015 8:45 AM

## 2014-10-22 ENCOUNTER — Inpatient Hospital Stay (HOSPITAL_COMMUNITY)
Admission: AD | Admit: 2014-10-22 | Discharge: 2014-10-22 | Disposition: A | Discharge status: Home / Self Care | Payer: Medicaid Other | Source: Ambulatory Visit | Attending: Obstetrics & Gynecology | Admitting: Obstetrics & Gynecology

## 2014-10-22 ENCOUNTER — Encounter (HOSPITAL_COMMUNITY): Payer: Self-pay | Admitting: *Deleted

## 2014-10-22 DIAGNOSIS — B3731 Acute candidiasis of vulva and vagina: Secondary | ICD-10-CM

## 2014-10-22 DIAGNOSIS — B373 Candidiasis of vulva and vagina: Secondary | ICD-10-CM | POA: Insufficient documentation

## 2014-10-22 DIAGNOSIS — N3000 Acute cystitis without hematuria: Secondary | ICD-10-CM

## 2014-10-22 DIAGNOSIS — O9989 Other specified diseases and conditions complicating pregnancy, childbirth and the puerperium: Secondary | ICD-10-CM

## 2014-10-22 DIAGNOSIS — R109 Unspecified abdominal pain: Secondary | ICD-10-CM | POA: Diagnosis present

## 2014-10-22 DIAGNOSIS — O26899 Other specified pregnancy related conditions, unspecified trimester: Secondary | ICD-10-CM

## 2014-10-22 DIAGNOSIS — O2342 Unspecified infection of urinary tract in pregnancy, second trimester: Secondary | ICD-10-CM | POA: Diagnosis not present

## 2014-10-22 DIAGNOSIS — O98812 Other maternal infectious and parasitic diseases complicating pregnancy, second trimester: Secondary | ICD-10-CM | POA: Insufficient documentation

## 2014-10-22 DIAGNOSIS — Z3A18 18 weeks gestation of pregnancy: Secondary | ICD-10-CM | POA: Diagnosis not present

## 2014-10-22 DIAGNOSIS — Z3A19 19 weeks gestation of pregnancy: Secondary | ICD-10-CM

## 2014-10-22 LAB — URINALYSIS, ROUTINE W REFLEX MICROSCOPIC
Bilirubin Urine: NEGATIVE
GLUCOSE, UA: NEGATIVE mg/dL
Hgb urine dipstick: NEGATIVE
KETONES UR: NEGATIVE mg/dL
NITRITE: POSITIVE — AB
PROTEIN: NEGATIVE mg/dL
UROBILINOGEN UA: 0.2 mg/dL (ref 0.0–1.0)
pH: 6 (ref 5.0–8.0)

## 2014-10-22 LAB — WET PREP, GENITAL: Trich, Wet Prep: NONE SEEN

## 2014-10-22 LAB — URINE MICROSCOPIC-ADD ON

## 2014-10-22 MED ORDER — FLUCONAZOLE 150 MG PO TABS: ORAL_TABLET | ORAL | Status: DC

## 2014-10-22 MED ORDER — TERCONAZOLE 0.8 % VA CREA: TOPICAL_CREAM | VAGINAL | Status: DC

## 2014-10-22 MED ORDER — NITROFURANTOIN MONOHYD MACRO 100 MG PO CAPS: 100.0000 mg | ORAL_CAPSULE | Freq: Two times a day (BID) | ORAL | Status: DC

## 2014-10-22 NOTE — MAU Note (Signed)
C/o abdominal cramping and pelvic pressure since yesterday around about 0800;

## 2014-10-22 NOTE — MAU Provider Note (Signed)
History     CSN: [id number]  Arrival date and time: 10/22/14 1509   First Provider Initiated Contact with Patient 10/22/14 1541      Chief Complaint  Patient presents with  . Abdominal Pain   HPI Tracy Hanna is 24 y.o. G3P2002 5376w2d weeks presenting with irregular cramping lasting 10 minutes since 8am yesterday.  At time of pain rates as 10/10.  At present time she is comfortable.  She had same sxs with last previous pregnancy.  Nausea/ Vomiting X 2 since onset of cramping  + vaginal discharge.  Last intercourse 3 days ago.  Neg for vaginal bleeding or loss of fluid.[place] and plans care with [facility], who delivered last baby.  Per previous record, [facility].  Had Premature labor with premature dilatation of cervix at [redacted] weeks gestation.  Delivered at 8081w3d.      Past Medical History  Diagnosis Date  . Anemia   . Ovarian cyst     with current pregnancy   . Anxiety AGE 24    NO MEDS CURRENTLY  . Infection     UTI WITH PREGNANCY  . Infection 2008    TRICH  . Infection     OCC yeast  . Premature dilatation of cervix during pregnancy 04/09/2012    3 cm    Past Surgical History  Procedure Laterality Date  . No past surgeries      Family History  Problem Relation Age of Onset  . Other Neg Hx   . Hypertension Mother   . Asthma Father   . Hypertension Father   . Asthma Sister   . Early death Sister 5    ASTHMA  . Asthma Brother   . Asthma Maternal Aunt   . Arthritis Maternal Grandmother   . Diabetes Maternal Grandmother   . Hypertension Maternal Grandmother   . Arthritis Maternal Grandfather     History  Substance Use Topics  . Smoking status: Never Smoker   . Smokeless tobacco: Never Used  . Alcohol Use: No     Comment: OCC    Allergies: No Known Allergies  Prescriptions prior to admission  Medication Sig Dispense Refill Last Dose  . Multiple Vitamins-Minerals (MULTIVITAMIN PO) Take 1  tablet by mouth 4 (four) times a week.   Past Week at Unknown time  . amoxicillin (AMOXIL) 500 MG capsule Take 1 capsule (500 mg total) by mouth 3 (three) times daily. (Patient not taking: Reported on 10/22/2014) 21 capsule 0     Review of Systems  Constitutional: Negative for fever and chills.  Gastrointestinal: Positive for nausea, vomiting and abdominal pain (intermittent  ?  contractions).  Genitourinary: Negative for dysuria, urgency, frequency and hematuria.       Neg for vaginal bleeding or loss of fluid.  + for vaginal discharge.   Neurological: Negative for headaches.   Physical Exam   Blood pressure 123/72, pulse 98, temperature 97.5 F (36.4 C), temperature source Oral, resp. rate 16, last menstrual period 05/20/2014.  Physical Exam  Constitutional: She is oriented to person, place, and time. She appears well-developed and well-nourished. No distress.  HENT:  Head: Normocephalic.  Neck: Normal range of motion.  Cardiovascular: Normal rate.   Respiratory: Effort normal.  GI: Soft. She exhibits no distension and no mass. There is no tenderness. There is no rebound and no guarding.  Genitourinary: There is no rash, tenderness or lesion on the right labia.  There is no rash, tenderness or lesion on the left labia. Uterus is enlarged (18 week size). Cervix exhibits no motion tenderness, no discharge and no friability. There is erythema (vaginal tissue is very red without ulceration) in the vagina. No tenderness or bleeding in the vagina. No foreign body around the vagina. Vaginal discharge found.  Cervix is closed  Neurological: She is alert and oriented to person, place, and time.  Skin: Skin is warm and dry.  Psychiatric: She has a normal mood and affect. Her behavior is normal.   FETAL HEART RATE BY DOPPLER 158  Results for orders placed or performed during the hospital encounter of 10/22/14 (from the past 24 hour(s))  Urinalysis, Routine w reflex microscopic     Status:  Abnormal   Collection Time: 10/22/14  3:15 PM  Result Value Ref Range   Color, Urine YELLOW YELLOW   APPearance CLEAR CLEAR   Specific Gravity, Urine >1.030 (H) 1.005 - 1.030   pH 6.0 5.0 - 8.0   Glucose, UA NEGATIVE NEGATIVE mg/dL   Hgb urine dipstick NEGATIVE NEGATIVE   Bilirubin Urine NEGATIVE NEGATIVE   Ketones, ur NEGATIVE NEGATIVE mg/dL   Protein, ur NEGATIVE NEGATIVE mg/dL   Urobilinogen, UA 0.2 0.0 - 1.0 mg/dL   Nitrite POSITIVE (A) NEGATIVE   Leukocytes, UA SMALL (A) NEGATIVE  Urine microscopic-add on     Status: Abnormal   Collection Time: 10/22/14  3:15 PM  Result Value Ref Range   Squamous Epithelial / LPF FEW (A) RARE   WBC, UA 3-6 <3 WBC/hpf   RBC / HPF 3-6 <3 RBC/hpf   Bacteria, UA MANY (A) RARE   Urine-Other MUCOUS PRESENT   Wet prep, genital     Status: Abnormal   Collection Time: 10/22/14  4:05 PM  Result Value Ref Range   Yeast Wet Prep HPF POC FEW (A) NONE SEEN   Trich, Wet Prep NONE SEEN NONE SEEN   Clue Cells Wet Prep HPF POC FEW (A) NONE SEEN   WBC, Wet Prep HPF POC MANY (A) NONE SEEN   TOCO-Contractions not seen  MAU Course  Procedures  GC/CHL and urine culture pending  MDM   Assessment and Plan  A:  Abdominal pain in second trimester-[redacted]w[redacted]d gestation by LMP      + Fetal Heart Rate      Urinary Tract Infection      Vaginal Yeast  P:  Rx for Macrobid, Diflucan and Terazol Creme      Encouraged her to stay well hydrated      Call [facility] to begin prenatal care  KEY,EVE M 10/22/2014, 4:51 PM

## 2014-10-22 NOTE — Discharge Instructions (Signed)
Abdominal Pain During Pregnancy Abdominal pain is common in pregnancy. Most of the time, it does not cause harm. There are many causes of abdominal pain. Some causes are more serious than others. Some of the causes of abdominal pain in pregnancy are easily diagnosed. Occasionally, the diagnosis takes time to understand. Other times, the cause is not determined. Abdominal pain can be a sign that something is very wrong with the pregnancy, or the pain may have nothing to do with the pregnancy at all. For this reason, always tell your health care provider if you have any abdominal discomfort. HOME CARE INSTRUCTIONS  Monitor your abdominal pain for any changes. The following actions may help to alleviate any discomfort you are experiencing:  Do not have sexual intercourse or put anything in your vagina until your symptoms go away completely.  Get plenty of rest until your pain improves.  Drink clear fluids if you feel nauseous. Avoid solid food as long as you are uncomfortable or nauseous.  Only take over-the-counter or prescription medicine as directed by your health care provider.  Keep all follow-up appointments with your health care provider. SEEK IMMEDIATE MEDICAL CARE IF:  You are bleeding, leaking fluid, or passing tissue from the vagina.  You have increasing pain or cramping.  You have persistent vomiting.  You have painful or bloody urination.  You have a fever.  You notice a decrease in your baby's movements.  You have extreme weakness or feel faint.  You have shortness of breath, with or without abdominal pain.  You develop a severe headache with abdominal pain.  You have abnormal vaginal discharge with abdominal pain.  You have persistent diarrhea.  You have abdominal pain that continues even after rest, or gets worse. MAKE SURE YOU:   Understand these instructions.  Will watch your condition.  Will get help right away if you are not doing well or get  worse. Document Released: 09/03/2005 Document Revised: 06/24/2013 Document Reviewed: 04/02/2013 [facility] Patient Information 2015 [place], [place]. This information is not intended to replace advice given to you by your health care provider. Make sure you discuss any questions you have with your health care provider. Monilial Vaginitis Vaginitis in a soreness, swelling and redness (inflammation) of the vagina and vulva. Monilial vaginitis is not a sexually transmitted infection. CAUSES  Yeast vaginitis is caused by yeast (candida) that is normally found in your vagina. With a yeast infection, the candida has overgrown in number to a point that upsets the chemical balance. SYMPTOMS   White, thick vaginal discharge.  Swelling, itching, redness and irritation of the vagina and possibly the lips of the vagina (vulva).  Burning or painful urination.  Painful intercourse. DIAGNOSIS  Things that may contribute to monilial vaginitis are:  Postmenopausal and virginal states.  Pregnancy.  Infections.  Being tired, sick or stressed, especially if you had monilial vaginitis in the past.  Diabetes. Good control will help lower the chance.  Birth control pills.  Tight fitting garments.  Using bubble bath, feminine sprays, douches or deodorant tampons.  Taking certain medications that kill germs (antibiotics).  Sporadic recurrence can occur if you become ill. TREATMENT  Your caregiver will give you medication.  There are several kinds of anti monilial vaginal creams and suppositories specific for monilial vaginitis. For recurrent yeast infections, use a suppository or cream in the vagina 2 times a week, or as directed.  Anti-monilial or steroid cream for the itching or irritation of the vulva may also be used. Get  your caregiver's permission.  Painting the vagina with methylene blue solution may help if the monilial cream does not work.  Eating yogurt may help prevent monilial  vaginitis. HOME CARE INSTRUCTIONS   Finish all medication as prescribed.  Do not have sex until treatment is completed or after your caregiver tells you it is okay.  Take warm sitz baths.  Do not douche.  Do not use tampons, especially scented ones.  Wear cotton underwear.  Avoid tight pants and panty hose.  Tell your sexual partner that you have a yeast infection. They should go to their caregiver if they have symptoms such as mild rash or itching.  Your sexual partner should be treated as well if your infection is difficult to eliminate.  Practice safer sex. Use condoms.  Some vaginal medications cause latex condoms to fail. Vaginal medications that harm condoms are:  Cleocin cream.  Butoconazole (Femstat).  Terconazole (Terazol) vaginal suppository.  Miconazole (Monistat) (may be purchased over the counter). SEEK MEDICAL CARE IF:   You have a temperature by mouth above 102 F (38.9 C).  The infection is getting worse after 2 days of treatment.  The infection is not getting better after 3 days of treatment.  You develop blisters in or around your vagina.  You develop vaginal bleeding, and it is not your menstrual period.  You have pain when you urinate.  You develop intestinal problems.  You have pain with sexual intercourse. Document Released: 06/13/2005 Document Revised: 11/26/2011 Document Reviewed: 02/25/2009 [facility] Patient Information 2015 [place], [place]. This information is not intended to replace advice given to you by your health care provider. Make sure you discuss any questions you have with your health care provider. Urinary Tract Infection Urinary tract infections (UTIs) can develop anywhere along your urinary tract. Your urinary tract is your body's drainage system for removing wastes and extra water. Your urinary tract includes two kidneys, two ureters, a bladder, and a urethra. Your kidneys are a pair of bean-shaped organs. Each kidney is  about the size of your fist. They are located below your ribs, one on each side of your spine. CAUSES Infections are caused by microbes, which are microscopic organisms, including fungi, viruses, and bacteria. These organisms are so small that they can only be seen through a microscope. Bacteria are the microbes that most commonly cause UTIs. SYMPTOMS  Symptoms of UTIs may vary by age and gender of the patient and by the location of the infection. Symptoms in young women typically include a frequent and intense urge to urinate and a painful, burning feeling in the bladder or urethra during urination. Older women and men are more likely to be tired, shaky, and weak and have muscle aches and abdominal pain. A fever may mean the infection is in your kidneys. Other symptoms of a kidney infection include pain in your back or sides below the ribs, nausea, and vomiting. DIAGNOSIS To diagnose a UTI, your caregiver will ask you about your symptoms. Your caregiver also will ask to provide a urine sample. The urine sample will be tested for bacteria and white blood cells. White blood cells are made by your body to help fight infection. TREATMENT  Typically, UTIs can be treated with medication. Because most UTIs are caused by a bacterial infection, they usually can be treated with the use of antibiotics. The choice of antibiotic and length of treatment depend on your symptoms and the type of bacteria causing your infection. HOME CARE INSTRUCTIONS  If you were  prescribed antibiotics, take them exactly as your caregiver instructs you. Finish the medication even if you feel better after you have only taken some of the medication.  Drink enough water and fluids to keep your urine clear or pale yellow.  Avoid caffeine, tea, and carbonated beverages. They tend to irritate your bladder.  Empty your bladder often. Avoid holding urine for long periods of time.  Empty your bladder before and after sexual  intercourse.  After a bowel movement, women should cleanse from front to back. Use each tissue only once. SEEK MEDICAL CARE IF:   You have back pain.  You develop a fever.  Your symptoms do not begin to resolve within 3 days. SEEK IMMEDIATE MEDICAL CARE IF:   You have severe back pain or lower abdominal pain.  You develop chills.  You have nausea or vomiting.  You have continued burning or discomfort with urination. MAKE SURE YOU:   Understand these instructions.  Will watch your condition.  Will get help right away if you are not doing well or get worse. Document Released: 06/13/2005 Document Revised: 03/04/2012 Document Reviewed: 10/12/2011 [facility] Patient Information 2015 [place]. This information is not intended to replace advice given to you by your health care provider. Make sure you discuss any questions you have with your health care provider.

## 2014-10-25 LAB — CULTURE, OB URINE
Colony Count: >=100000
Special Requests: NORMAL

## 2014-10-25 LAB — GC/CHLAMYDIA PROBE AMP (~~LOC~~) NOT AT ARMC
Chlamydia: NEGATIVE
Neisseria Gonorrhea: NEGATIVE

## 2015-01-26 ENCOUNTER — Other Ambulatory Visit (HOSPITAL_COMMUNITY)
Admission: RE | Admit: 2015-01-26 | Discharge: 2015-01-26 | Disposition: A | Discharge status: Home / Self Care | Payer: Medicaid Other | Source: Ambulatory Visit | Attending: Family | Admitting: Family

## 2015-01-26 ENCOUNTER — Ambulatory Visit (INDEPENDENT_AMBULATORY_CARE_PROVIDER_SITE_OTHER): Payer: Medicaid Other | Admitting: Family

## 2015-01-26 ENCOUNTER — Encounter: Payer: Self-pay | Admitting: Family

## 2015-01-26 VITALS — BP 129/75 | HR 98 | Wt 157.5 lb

## 2015-01-26 DIAGNOSIS — O0930 Supervision of pregnancy with insufficient antenatal care, unspecified trimester: Secondary | ICD-10-CM | POA: Insufficient documentation

## 2015-01-26 DIAGNOSIS — Z113 Encounter for screening for infections with a predominantly sexual mode of transmission: Secondary | ICD-10-CM | POA: Diagnosis present

## 2015-01-26 DIAGNOSIS — O0933 Supervision of pregnancy with insufficient antenatal care, third trimester: Secondary | ICD-10-CM

## 2015-01-26 DIAGNOSIS — Z01411 Encounter for gynecological examination (general) (routine) with abnormal findings: Secondary | ICD-10-CM | POA: Diagnosis not present

## 2015-01-26 DIAGNOSIS — Z3483 Encounter for supervision of other normal pregnancy, third trimester: Secondary | ICD-10-CM | POA: Diagnosis not present

## 2015-01-26 DIAGNOSIS — Z23 Encounter for immunization: Secondary | ICD-10-CM | POA: Diagnosis not present

## 2015-01-26 LAB — POCT URINALYSIS DIP (DEVICE)
BILIRUBIN URINE: NEGATIVE
Glucose, UA: NEGATIVE mg/dL
Hgb urine dipstick: NEGATIVE
Ketones, ur: NEGATIVE mg/dL
Leukocytes, UA: NEGATIVE
NITRITE: NEGATIVE
PH: 6 (ref 5.0–8.0)
Protein, ur: NEGATIVE mg/dL
Specific Gravity, Urine: 1.02 (ref 1.005–1.030)
UROBILINOGEN UA: 0.2 mg/dL (ref 0.0–1.0)

## 2015-01-26 MED ORDER — TETANUS-DIPHTH-ACELL PERTUSSIS 5-2.5-18.5 LF-MCG/0.5 IM SUSP
0.5000 mL | Freq: Once | INTRAMUSCULAR | Status: AC
  Administered 2015-01-26: 0.5 mL via INTRAMUSCULAR

## 2015-01-26 NOTE — Progress Notes (Signed)
Pain/pressure in pelvis/lower groin

## 2015-01-26 NOTE — Progress Notes (Signed)
Subjective:    Tracy Hanna is a [id number] 5760w0d being seen today for her first obstetrical visit.  Her obstetrical history is significant for late prenatal care. Patient does intend to breast feed. Pregnancy history fully reviewed.  Patient reports no bleeding and Braxton Hicks intermittent approximately 5/hour at the maximum.  Filed Vitals:   01/26/15 0927  BP: 129/75  Pulse: 98  Weight: 157 lb 8 oz (71.442 kg)    HISTORY: OB History  Gravida Para Term Preterm AB SAB TAB Ectopic Multiple Living  3 2 2  0 0 0 0 0 0 2    # Outcome Date GA Lbr Len/2nd Weight Sex Delivery Anes PTL Lv  3 Current           2 Term 06/05/12 1630w3d 09:30 / 00:23 5 lb 13 oz (2.637 kg) F Vag-Spont Local  Y  1 Term 11/29/07 5316w0d 12:00 5 lb 14 oz (2.665 kg) F Vag-Spont EPI  Y     Comments: ?PROM 32 WKS     Past Medical History  Diagnosis Date  . Anemia   . Ovarian cyst     with current pregnancy   . Anxiety AGE 16    NO MEDS CURRENTLY  . Infection     UTI WITH PREGNANCY  . Infection 2008    TRICH  . Infection     OCC yeast  . Premature dilatation of cervix during pregnancy 04/09/2012    3 cm   Past Surgical History  Procedure Laterality Date  . No past surgeries     Family History  Problem Relation Age of Onset  . Other Neg Hx   . Hypertension Mother   . Asthma Father   . Hypertension Father   . Asthma Sister   . Early death Sister 5    ASTHMA  . Asthma Brother   . Asthma Maternal Aunt   . Arthritis Maternal Grandmother   . Diabetes Maternal Grandmother   . Hypertension Maternal Grandmother   . Arthritis Maternal Grandfather      Exam    BP 129/75 mmHg  Pulse 98  Wt 157 lb 8 oz (71.442 kg)  LMP 05/20/2014 (Approximate) Uterine Size: size equals dates  Pelvic Exam:    Perineum: No Hemorrhoids, Normal Perineum   Vulva: normal   Vagina:  normal mucosa, normal discharge, no palpable nodules   pH: Not done   Cervix: no bleeding following Pap, no cervical motion  tenderness and no lesions   Adnexa: normal adnexa and no mass, fullness, tenderness   Bony Pelvis: Adequate  System: Breast:  No nipple retraction or dimpling, No nipple discharge or bleeding, No axillary or supraclavicular adenopathy, Normal to palpation without dominant masses   Skin: normal coloration and turgor, no rashes    Neurologic: negative   Extremities: normal strength, tone, and muscle mass   HEENT neck supple with midline trachea and thyroid without masses   Mouth/Teeth mucous membranes moist, pharynx normal without lesions   Neck supple and no masses   Cardiovascular: regular rate and rhythm, no murmurs or gallops   Respiratory:  appears well, vitals normal, no respiratory distress, acyanotic, normal RR, neck free of mass or lymphadenopathy, chest clear, no wheezing, crepitations, rhonchi, normal symmetric air entry   Abdomen: soft, non-tender; bowel sounds normal; no masses,  no organomegaly   Urinary: urethral meatus normal      Assessment:    Pregnancy: [id number] Patient Active Problem List   Diagnosis Date Noted  .  Encounter for supervision of other normal pregnancy in third trimester 01/26/2015  . Late prenatal care affecting pregnancy 01/26/2015  . Anxiety 03/18/2012        Plan:     Initial labs drawn. Pap smear obtained with GC/CT.   Prenatal vitamins.  Tdap today. Problem list reviewed and updated. Genetic Screening discussed;  Too late  Ultrasound discussed; fetal survey: ordered.  Follow up in 2 weeks.  Marlis EdelsonKARIM, WALIDAH N 01/26/2015

## 2015-01-26 NOTE — Patient Instructions (Addendum)
Prenatal Care  WHAT IS PRENATAL CARE?  Prenatal care means health care during your pregnancy, before your baby is born. It is very important to take care of yourself and your baby during your pregnancy by:   Getting early prenatal care. If you know you are pregnant, or think you might be pregnant, call your health care provider as soon as possible. Schedule a visit for a prenatal exam.  Getting regular prenatal care. Follow your health care provider's schedule for blood and other necessary tests. Do not miss appointments.  Doing everything you can to keep yourself and your baby healthy during your pregnancy.  Getting complete care. Prenatal care should include evaluation of the medical, dietary, educational, psychological, and social needs of you and your significant other. The medical and genetic history of your family and the family of your baby's father should be discussed with your health care provider.  Discussing with your health care provider:  Prescription, over-the-counter, and herbal medicines that you take.  Any history of substance abuse, alcohol use, smoking, and illegal drug use.  Any history of domestic abuse and violence.  Immunizations you have received.  Your nutrition and diet.  The amount of exercise you do.  Any environmental and occupational hazards to which you are exposed.  History of sexually transmitted infections for both you and your partner.  Previous pregnancies you have had. WHY IS PRENATAL CARE SO IMPORTANT?  By regularly seeing your health care provider, you help ensure that problems can be identified early so that they can be treated as soon as possible. Other problems might be prevented. Many studies have shown that early and regular prenatal care is important for the health of mothers and their babies.  HOW CAN I TAKE CARE OF MYSELF WHILE I AM PREGNANT?  Here are ways to take care of yourself and your baby:   Start or continue taking your  multivitamin with 400 micrograms (mcg) of folic acid every day.  Get early and regular prenatal care. It is very important to see a health care provider during your pregnancy. Your health care provider will check at each visit to make sure that you and your baby are healthy. If there are any problems, action can be taken right away to help you and your baby.  Eat a healthy diet that includes:  Fruits.  Vegetables.  Foods low in saturated fat.  Whole grains.  Calcium-rich foods, such as milk, yogurt, and hard cheeses.  Drink 6-8 glasses of liquids a day.  Unless your health care provider tells you not to, try to be physically active for 30 minutes, most days of the week. If you are pressed for time, you can get your activity in through 10-minute segments, three times a day.  Do not smoke, drink alcohol, or use drugs. These can cause long-term damage to your baby. Talk with your health care provider about steps to take to stop smoking. Talk with a member of your faith community, a counselor, a trusted friend, or your health care provider if you are concerned about your alcohol or drug use.  Ask your health care provider before taking any medicine, even over-the-counter medicines. Some medicines are not safe to take during pregnancy.  Get plenty of rest and sleep.  Avoid hot tubs and saunas during pregnancy.  Do not have X-rays taken unless absolutely necessary and with the recommendation of your health care provider. A lead shield can be placed on your abdomen to protect your baby when   X-rays are taken in other parts of your body.  Do not empty the cat litter when you are pregnant. It may contain a parasite that causes an infection called toxoplasmosis, which can cause birth defects. Also, use gloves when working in garden areas used by cats.  Do not eat uncooked or undercooked meats or fish.  Do not eat soft, mold-ripened cheeses (Brie, Camembert, and chevre) or soft, blue-veined  cheese (Danish blue and Roquefort).  Stay away from toxic chemicals like:  Insecticides.  Solvents (some cleaners or paint thinners).  Lead.  Mercury.  Sexual intercourse may continue until the end of the pregnancy, unless you have a medical problem or there is a problem with the pregnancy and your health care provider tells you not to.  Do not wear high-heel shoes, especially during the second half of the pregnancy. You can lose your balance and fall.  Do not take long trips, unless absolutely necessary. Be sure to see your health care provider before going on the trip.  Do not sit in one position for more than 2 hours when on a trip.  Take a copy of your medical records when going on a trip. Know where a hospital is located in the city you are visiting, in case of an emergency.  Most dangerous household products will have pregnancy warnings on their labels. Ask your health care provider about products if you are unsure.  Limit or eliminate your caffeine intake from coffee, tea, sodas, medicines, and chocolate.  Many women continue working through pregnancy. Staying active might help you stay healthier. If you have a question about the safety or the hours you work at your particular job, talk with your health care provider.  Get informed:  Read books.  Watch videos.  Go to childbirth classes for you and your significant other.  Talk with experienced moms.  Ask your health care provider about childbirth education classes for you and your partner. Classes can help you and your partner prepare for the birth of your baby.  Ask about a baby doctor (pediatrician) and methods and pain medicine for labor, delivery, and possible cesarean delivery. HOW OFTEN SHOULD I SEE MY HEALTH CARE PROVIDER DURING PREGNANCY?  Your health care provider will give you a schedule for your prenatal visits. You will have visits more often as you get closer to the end of your pregnancy. An average  pregnancy lasts about 40 weeks.  A typical schedule includes visiting your health care provider:   About once each month during your first 6 months of pregnancy.  Every 2 weeks during the next 2 months.  Weekly in the last month, until the delivery date. Your health care provider will probably want to see you more often if:  You are older than 35 years.  Your pregnancy is high risk because you have certain health problems or problems with the pregnancy, such as:  Diabetes.  High blood pressure.  The baby is not growing on schedule, according to the dates of the pregnancy. Your health care provider will do special tests to make sure you and your baby are not having any serious problems. WHAT HAPPENS DURING PRENATAL VISITS?   At your first prenatal visit, your health care provider will do a physical exam and talk to you about your health history and the health history of your partner and your family. Your health care provider will be able to tell you what date to expect your baby to be born on.  Your   first physical exam will include checks of your blood pressure, measurements of your height and weight, and an exam of your pelvic organs. Your health care provider will do a Pap test if you have not had one recently and will do cultures of your cervix to make sure there is no infection.  At each prenatal visit, there will be tests of your blood, urine, blood pressure, weight, and the progress of the baby will be checked.  At your later prenatal visits, your health care provider will check how you are doing and how your baby is developing. You may have a number of tests done as your pregnancy progresses.  Ultrasound exams are often used to check on your baby's growth and health.  You may have more urine and blood tests, as well as special tests, if needed. These may include amniocentesis to examine fluid in the pregnancy sac, stress tests to check how the baby responds to contractions, or a  biophysical profile to measure your baby's well-being. Your health care provider will explain the tests and why they are necessary.  You should be tested for high blood sugar (gestational diabetes) between the 24th and 28th weeks of your pregnancy.  You should discuss with your health care provider your plans to breastfeed or bottle-feed your baby.  Each visit is also a chance for you to learn about staying healthy during pregnancy and to ask questions. Document Released: 09/06/2003 Document Revised: 09/08/2013 Document Reviewed: 11/18/2013 [facility] Patient Information 2015 [place]ExitCare, [place]LL[place]. This information is not intended to replace advice given to you by your health care provider. Make sure you discuss any questions you have with your health care provider.  Third Trimester of Pregnancy The third trimester is from week 29 through week 42, months 7 through 9. The third trimester is a time when the fetus is growing rapidly. At the end of the ninth month, the fetus is about 20 inches in length and weighs 6-10 pounds.  BODY CHANGES Your body goes through many changes during pregnancy. The changes vary from woman to woman.   Your weight will continue to increase. You can expect to gain 25-35 pounds (11-16 kg) by the end of the pregnancy.  You may begin to get stretch marks on your hips, abdomen, and breasts.  You may urinate more often because the fetus is moving lower into your pelvis and pressing on your bladder.  You may develop or continue to have heartburn as a result of your pregnancy.  You may develop constipation because certain hormones are causing the muscles that push waste through your intestines to slow down.  You may develop hemorrhoids or swollen, bulging veins (varicose veins).  You may have pelvic pain because of the weight gain and pregnancy hormones relaxing your joints between the bones in your pelvis. Backaches may result from overexertion of the muscles supporting your  posture.  You may have changes in your hair. These can include thickening of your hair, rapid growth, and changes in texture. Some women also have hair loss during or after pregnancy, or hair that feels dry or thin. Your hair will most likely return to normal after your baby is born.  Your breasts will continue to grow and be tender. A yellow discharge may leak from your breasts called colostrum.  Your belly button may stick out.  You may feel short of breath because of your expanding uterus.  You may notice the fetus "dropping," or moving lower in your abdomen.  You may  have a bloody mucus discharge. This usually occurs a few days to a week before labor begins.  Your cervix becomes thin and soft (effaced) near your due date. WHAT TO EXPECT AT YOUR PRENATAL EXAMS  You will have prenatal exams every 2 weeks until week 36. Then, you will have weekly prenatal exams. During a routine prenatal visit:  You will be weighed to make sure you and the fetus are growing normally.  Your blood pressure is taken.  Your abdomen will be measured to track your baby's growth.  The fetal heartbeat will be listened to.  Any test results from the previous visit will be discussed.  You may have a cervical check near your due date to see if you have effaced. At around 36 weeks, your caregiver will check your cervix. At the same time, your caregiver will also perform a test on the secretions of the vaginal tissue. This test is to determine if a type of bacteria, Group B streptococcus, is present. Your caregiver will explain this further. Your caregiver may ask you:  What your birth plan is.  How you are feeling.  If you are feeling the baby move.  If you have had any abnormal symptoms, such as leaking fluid, bleeding, severe headaches, or abdominal cramping.  If you have any questions. Other tests or screenings that may be performed during your third trimester include:  Blood tests that check for  low iron levels (anemia).  Fetal testing to check the health, activity level, and growth of the fetus. Testing is done if you have certain medical conditions or if there are problems during the pregnancy. FALSE LABOR You may feel small, irregular contractions that eventually go away. These are called Braxton Hicks contractions, or false labor. Contractions may last for hours, days, or even weeks before true labor sets in. If contractions come at regular intervals, intensify, or become painful, it is best to be seen by your caregiver.  SIGNS OF LABOR   Menstrual-like cramps.  Contractions that are 5 minutes apart or less.  Contractions that start on the top of the uterus and spread down to the lower abdomen and back.  A sense of increased pelvic pressure or back pain.  A watery or bloody mucus discharge that comes from the vagina. If you have any of these signs before the 37th week of pregnancy, call your caregiver right away. You need to go to the hospital to get checked immediately. HOME CARE INSTRUCTIONS   Avoid all smoking, herbs, alcohol, and unprescribed drugs. These chemicals affect the formation and growth of the baby.  Follow your caregiver's instructions regarding medicine use. There are medicines that are either safe or unsafe to take during pregnancy.  Exercise only as directed by your caregiver. Experiencing uterine cramps is a good sign to stop exercising.  Continue to eat regular, healthy meals.  Wear a good support bra for breast tenderness.  Do not use hot tubs, steam rooms, or saunas.  Wear your seat belt at all times when driving.  Avoid raw meat, uncooked cheese, cat litter boxes, and soil used by cats. These carry germs that can cause birth defects in the baby.  Take your prenatal vitamins.  Try taking a stool softener (if your caregiver approves) if you develop constipation. Eat more high-fiber foods, such as fresh vegetables or fruit and whole grains. Drink  plenty of fluids to keep your urine clear or pale yellow.  Take warm sitz baths to soothe any pain or discomfort  caused by hemorrhoids. Use hemorrhoid cream if your caregiver approves.  If you develop varicose veins, wear support hose. Elevate your feet for 15 minutes, 3-4 times a day. Limit salt in your diet.  Avoid heavy lifting, wear low heal shoes, and practice good posture.  Rest a lot with your legs elevated if you have leg cramps or low back pain.  Visit your dentist if you have not gone during your pregnancy. Use a soft toothbrush to brush your teeth and be gentle when you floss.  A sexual relationship may be continued unless your caregiver directs you otherwise.  Do not travel far distances unless it is absolutely necessary and only with the approval of your caregiver.  Take prenatal classes to understand, practice, and ask questions about the labor and delivery.  Make a trial run to the hospital.  Pack your hospital bag.  Prepare the baby's nursery.  Continue to go to all your prenatal visits as directed by your caregiver. SEEK MEDICAL CARE IF:  You are unsure if you are in labor or if your water has broken.  You have dizziness.  You have mild pelvic cramps, pelvic pressure, or nagging pain in your abdominal area.  You have persistent nausea, vomiting, or diarrhea.  You have a bad smelling vaginal discharge.  You have pain with urination. SEEK IMMEDIATE MEDICAL CARE IF:   You have a fever.  You are leaking fluid from your vagina.  You have spotting or bleeding from your vagina.  You have severe abdominal cramping or pain.  You have rapid weight loss or gain.  You have shortness of breath with chest pain.  You notice sudden or extreme swelling of your face, hands, ankles, feet, or legs.  You have not felt your baby move in over an hour.  You have severe headaches that do not go away with medicine.  You have vision changes. Document Released:  08/28/2001 Document Revised: 09/08/2013 Document Reviewed: 11/04/2012 [facility]Exit[facility] Patient Information 2015 [place]ExitCare, [place]LLC. This information is not intended to replace advice given to you by your health care provider. Make sure you discuss any questions you have with your health care provider.

## 2015-01-27 LAB — PRENATAL PROFILE (SOLSTAS)
Antibody Screen: NEGATIVE
Basophils Absolute: 0 10*3/uL (ref 0.0–0.1)
Basophils Relative: 0 % (ref 0–1)
Eosinophils Absolute: 0.2 10*3/uL (ref 0.0–0.7)
Eosinophils Relative: 2 % (ref 0–5)
HEMATOCRIT: 30.3 % — AB (ref 36.0–46.0)
HIV: NONREACTIVE
Hemoglobin: 10.1 g/dL — ABNORMAL LOW (ref 12.0–15.0)
Hepatitis B Surface Ag: NEGATIVE
LYMPHS ABS: 1.2 10*3/uL (ref 0.7–4.0)
LYMPHS PCT: 14 % (ref 12–46)
MCH: 28 pg (ref 26.0–34.0)
MCHC: 33.3 g/dL (ref 30.0–36.0)
MCV: 83.9 fL (ref 78.0–100.0)
MONO ABS: 1.2 10*3/uL — AB (ref 0.1–1.0)
MONOS PCT: 14 % — AB (ref 3–12)
MPV: 10.7 fL (ref 8.6–12.4)
NEUTROS ABS: 6.1 10*3/uL (ref 1.7–7.7)
Neutrophils Relative %: 70 % (ref 43–77)
Platelets: 208 10*3/uL (ref 150–400)
RBC: 3.61 MIL/uL — ABNORMAL LOW (ref 3.87–5.11)
RDW: 13.9 % (ref 11.5–15.5)
Rh Type: POSITIVE
Rubella: 5.14 Index — ABNORMAL HIGH (ref <0.90)
WBC: 8.7 10*3/uL (ref 4.0–10.5)

## 2015-01-27 LAB — GLUCOSE TOLERANCE, 1 HOUR (50G) W/O FASTING: Glucose, 1 Hour GTT: 101 mg/dL (ref 70–140)

## 2015-01-27 LAB — CYTOLOGY - PAP

## 2015-01-28 ENCOUNTER — Ambulatory Visit (HOSPITAL_COMMUNITY)
Admission: RE | Admit: 2015-01-28 | Discharge: 2015-01-28 | Disposition: A | Discharge status: Home / Self Care | Payer: Medicaid Other | Source: Ambulatory Visit | Attending: Family | Admitting: Family

## 2015-01-28 DIAGNOSIS — O0930 Supervision of pregnancy with insufficient antenatal care, unspecified trimester: Secondary | ICD-10-CM | POA: Diagnosis present

## 2015-01-28 DIAGNOSIS — O0933 Supervision of pregnancy with insufficient antenatal care, third trimester: Secondary | ICD-10-CM | POA: Insufficient documentation

## 2015-01-28 DIAGNOSIS — Z3A00 Weeks of gestation of pregnancy not specified: Secondary | ICD-10-CM | POA: Insufficient documentation

## 2015-01-28 DIAGNOSIS — Z3A32 32 weeks gestation of pregnancy: Secondary | ICD-10-CM | POA: Insufficient documentation

## 2015-01-28 DIAGNOSIS — Z3689 Encounter for other specified antenatal screening: Secondary | ICD-10-CM | POA: Insufficient documentation

## 2015-01-28 LAB — PRESCRIPTION MONITORING PROFILE (19 PANEL)
Amphetamine/Meth: NEGATIVE ng/mL
BUPRENORPHINE, URINE: NEGATIVE ng/mL
Barbiturate Screen, Urine: NEGATIVE ng/mL
Benzodiazepine Screen, Urine: NEGATIVE ng/mL
Cannabinoid Scrn, Ur: NEGATIVE ng/mL
Carisoprodol, Urine: NEGATIVE ng/mL
Cocaine Metabolites: NEGATIVE ng/mL
Creatinine, Urine: 92.64 mg/dL (ref >20.0)
Fentanyl, Ur: NEGATIVE ng/mL
MDMA URINE: NEGATIVE ng/mL
Meperidine, Ur: NEGATIVE ng/mL
Methadone Screen, Urine: NEGATIVE ng/mL
Methaqualone: NEGATIVE ng/mL
NITRITES URINE, INITIAL: NEGATIVE ug/mL
OPIATE SCREEN, URINE: NEGATIVE ng/mL
Oxycodone Screen, Ur: NEGATIVE ng/mL
PH URINE, INITIAL: 6.5 pH (ref 4.5–8.9)
PROPOXYPHENE: NEGATIVE ng/mL
Phencyclidine, Ur: NEGATIVE ng/mL
TRAMADOL UR: NEGATIVE ng/mL
Tapentadol, urine: NEGATIVE ng/mL
Zolpidem, Urine: NEGATIVE ng/mL

## 2015-01-28 LAB — HEMOGLOBINOPATHY EVALUATION
Hemoglobin Other: 0 %
Hgb A2 Quant: 2.6 % (ref 2.2–3.2)
Hgb A: 97.4 % (ref 96.8–97.8)
Hgb F Quant: 0 % (ref 0.0–2.0)
Hgb S Quant: 0 %

## 2015-01-29 LAB — CULTURE, OB URINE: Colony Count: 7000

## 2015-02-09 ENCOUNTER — Ambulatory Visit (INDEPENDENT_AMBULATORY_CARE_PROVIDER_SITE_OTHER): Payer: Medicaid Other | Admitting: Certified Nurse Midwife

## 2015-02-09 VITALS — BP 128/71 | HR 95 | Wt 160.1 lb

## 2015-02-09 DIAGNOSIS — Z3483 Encounter for supervision of other normal pregnancy, third trimester: Secondary | ICD-10-CM | POA: Diagnosis not present

## 2015-02-09 DIAGNOSIS — O0933 Supervision of pregnancy with insufficient antenatal care, third trimester: Secondary | ICD-10-CM | POA: Diagnosis not present

## 2015-02-09 LAB — POCT URINALYSIS DIP (DEVICE)
Bilirubin Urine: NEGATIVE
Glucose, UA: NEGATIVE mg/dL
Hgb urine dipstick: NEGATIVE
Ketones, ur: NEGATIVE mg/dL
Nitrite: NEGATIVE
Protein, ur: 30 mg/dL — AB
Specific Gravity, Urine: >=1.03 (ref 1.005–1.030)
Urobilinogen, UA: 0.2 mg/dL (ref 0.0–1.0)
pH: 6 (ref 5.0–8.0)

## 2015-02-09 NOTE — Patient Instructions (Signed)
Preterm Birth °Preterm birth is a birth that happens before 37 weeks of pregnancy. Most pregnancies last about 39-41 weeks. Every week in the womb is important and is beneficial to the health of the infant. Infants born before 37 weeks of pregnancy are at a higher risk for complications. Depending on when the infant was born, he or she may be: °· Late preterm. Born between 32 weeks and 37 weeks of pregnancy. °· Very preterm. Born at less than 32 weeks of pregnancy. °· Extremely preterm. Born at less than 25 weeks of pregnancy. °The earlier a baby is born, the more likely the child will have issues related to prematurity. Complications and problems that can be seen in infants born too early include: °· Problems breathing (respiratory distress syndrome). °· Low birth weight. °· Problems feeding. °· Sleeping problems. °· Yellowing of the skin (jaundice). °· Infections such as pneumonia.  °Babies born very preterm or extremely preterm are at risk for more serious medical issues. These include: °· More severe breathing issues. °· Eyesight issues. °· Brain development issues (intraventricular hemorrhage). °· Behavioral and emotional development issues. °· Growth and developmental delays. °· Cerebral palsy. °· Serious feeding or bowel complications (necrotizing enterocolitis). °CAUSES  °There are two broad categories of preterm birth. °· Spontaneous preterm birth. This is a birth resulting from preterm labor (not medically induced) or preterm premature rupture of membranes (PPROM). °· Indicated preterm birth. This is a birth resulting from labor being medically induced due to health, personal, or social reasons. °RISK FACTORS °Preterm birth may be related to certain medical conditions, lifestyle factors, or demographic factors encountered by the mother or fetus. °· Medical conditions include: °¨ Multiple gestations (twins, triplets, and so on). °¨ Infection. °¨ Diabetes. °¨ Heart disease. °¨ Kidney disease. °¨ Cervical or  uterine abnormalities. °¨ Being underweight. °¨ High blood pressure or preeclampsia. °¨ Premature rupture of membranes (PROM). °¨ Birth defects in the fetus. °· Lifestyle factors include: °¨ Poor prenatal care. °¨ Poor nutrition or anemia. °¨ Cigarette smoking. °¨ Consuming alcohol. °¨ High levels of stress and lack of social or emotional support. °¨ Exposure to chemical or environmental toxins. °¨ Substance abuse. °· Demographic factors include: °¨ African-American ethnicity. °¨ Age (younger than 18 or older than 26 years of age). °¨ Low socioeconomic status. °Women with a history of preterm labor or who become pregnant within 18 months of giving birth are also at increased risk for preterm birth. °DIAGNOSIS  °Your health care provider may request additional tests to diagnose underlying complications resulting from preterm birth. Tests on the infant may include: °· Physical exam. °· Blood tests. °· Chest X-rays. °· Heart-lung monitoring. °TREATMENT  °After birth, special care will be taken to assess any problems or complications for the infant. Supportive care will be provided for the infant. Treatment depends on what problems are present and any complications that develop. Some preterm infants are cared for in a neonatal intensive care unit. In general, care may include: °· Maintaining temperature and oxygen in a clear heated box (baby isolette). °· Monitoring the infant's heart rate, breathing, and level of oxygen in the blood. °· Monitoring for signs of infection and, if needed, giving IV antibiotic medicine. °· Inserting a feeding tube (nose, mouth) or giving IV nutrition if unable to feed. °· Inserting a breathing tube (ventilation). °· Respiration support (continuous positive airway pressure [CPAP] or oxygen).  °Treatment will change as the infant builds up strength and is able to breathe and eat on his or her   own. For some infants, no special treatment is necessary. Parents may be educated on the potential  health risks of prematurity to the infant. °HOME CARE INSTRUCTIONS °· Understand your infant's special conditions and needs. It may be reassuring to learn about infant CPR. °· Monitor your infant in the car seat until he or she grows and matures. Infant car seats can cause breathing difficulties for preterm infants. °· Keep your infant warm. Dress your infant in layers and keep him or her away from drafts, especially in cold months of the year. °· Wash your hands thoroughly after going to the bathroom or changing a diaper. Late preterm infants may be more prone to infection. °· Follow all your health care provider's instructions for providing support and care to your preterm infant. °· Get support from organizations and groups that understand your challenges. °· Follow up with your infant's health care provider as directed. °Prevention °There are some things you can do to help lower your risk of having a preterm infant in the future. These include: °· Good prenatal care throughout the entire pregnancy. See a health care provider regularly for advice and tests. °· Management of underlying medical conditions. °· Proper self-care and lifestyle changes. °· Proper diet and weight control. °· Watching for signs of various infections. °SEEK MEDICAL CARE IF: °· Your infant has feeding difficulties. °· Your infant has sleeping difficulties. °· Your infant has breathing difficulties. °· Your infant's skin starts to look yellow. °· Your infant shows signs of infection, such as a stuffy nose, fever, crying, or bluish color of the skin. °FOR MORE INFORMATION °[employer] of [employer]: www.marchofdimes.com °Prematurity.org: www.prematurity.org °Document Released: 11/24/2003 Document Revised: 06/24/2013 Document Reviewed: 04/02/2013 °ExitCare® Patient Information ©2015 [employer]. This information is not intended to replace advice given to you by your health care provider. Make sure you discuss any questions you have with your health  care provider. ° °

## 2015-02-09 NOTE — Progress Notes (Signed)
Pt reports increasing contractions and becoming more painful.

## 2015-02-09 NOTE — Progress Notes (Signed)
Pt is having frequent contractions. Reviewed preterm labor.

## 2015-02-23 ENCOUNTER — Inpatient Hospital Stay (HOSPITAL_COMMUNITY): Payer: Medicaid Other | Admitting: Anesthesiology

## 2015-02-23 ENCOUNTER — Inpatient Hospital Stay (HOSPITAL_COMMUNITY)
Admission: AD | Admit: 2015-02-23 | Discharge: 2015-02-26 | DRG: 775 | Disposition: A | Discharge status: Home / Self Care | Payer: Medicaid Other | Source: Ambulatory Visit | Attending: Obstetrics & Gynecology | Admitting: Obstetrics & Gynecology

## 2015-02-23 ENCOUNTER — Encounter (HOSPITAL_COMMUNITY): Payer: Self-pay | Admitting: *Deleted

## 2015-02-23 DIAGNOSIS — K219 Gastro-esophageal reflux disease without esophagitis: Secondary | ICD-10-CM | POA: Diagnosis present

## 2015-02-23 DIAGNOSIS — Z3A36 36 weeks gestation of pregnancy: Secondary | ICD-10-CM | POA: Diagnosis present

## 2015-02-23 DIAGNOSIS — Z833 Family history of diabetes mellitus: Secondary | ICD-10-CM | POA: Diagnosis not present

## 2015-02-23 DIAGNOSIS — O9962 Diseases of the digestive system complicating childbirth: Secondary | ICD-10-CM | POA: Diagnosis present

## 2015-02-23 DIAGNOSIS — Z8249 Family history of ischemic heart disease and other diseases of the circulatory system: Secondary | ICD-10-CM | POA: Diagnosis not present

## 2015-02-23 DIAGNOSIS — O99824 Streptococcus B carrier state complicating childbirth: Secondary | ICD-10-CM | POA: Diagnosis present

## 2015-02-23 DIAGNOSIS — O0933 Supervision of pregnancy with insufficient antenatal care, third trimester: Secondary | ICD-10-CM | POA: Diagnosis not present

## 2015-02-23 LAB — CBC
HEMATOCRIT: 33.1 % — AB (ref 36.0–46.0)
Hemoglobin: 10.9 g/dL — ABNORMAL LOW (ref 12.0–15.0)
MCH: 27.3 pg (ref 26.0–34.0)
MCHC: 32.9 g/dL (ref 30.0–36.0)
MCV: 82.8 fL (ref 78.0–100.0)
Platelets: 195 10*3/uL (ref 150–400)
RBC: 4 MIL/uL (ref 3.87–5.11)
RDW: 14.6 % (ref 11.5–15.5)
WBC: 9.1 10*3/uL (ref 4.0–10.5)

## 2015-02-23 LAB — TYPE AND SCREEN
ABO/RH(D): A POS
Antibody Screen: NEGATIVE

## 2015-02-23 LAB — GROUP B STREP BY PCR: Group B strep by PCR: POSITIVE — AB

## 2015-02-23 LAB — OB RESULTS CONSOLE GBS: STREP GROUP B AG: POSITIVE

## 2015-02-23 MED ORDER — CITRIC ACID-SODIUM CITRATE 334-500 MG/5ML PO SOLN: 30.0000 mL | ORAL | Status: DC | PRN

## 2015-02-23 MED ORDER — DIPHENHYDRAMINE HCL 50 MG/ML IJ SOLN: 12.5000 mg | INTRAMUSCULAR | Status: DC | PRN

## 2015-02-23 MED ORDER — ACETAMINOPHEN 325 MG PO TABS: 650.0000 mg | ORAL_TABLET | ORAL | Status: DC | PRN

## 2015-02-23 MED ORDER — LIDOCAINE HCL (PF) 1 % IJ SOLN
30.0000 mL | INTRAMUSCULAR | Status: DC | PRN
  Filled 2015-02-23: qty 30

## 2015-02-23 MED ORDER — FENTANYL 2.5 MCG/ML BUPIVACAINE 1/10 % EPIDURAL INFUSION (WH - ANES)
14.0000 mL/h | INTRAMUSCULAR | Status: DC | PRN
  Filled 2015-02-23: qty 125

## 2015-02-23 MED ORDER — ONDANSETRON HCL 4 MG/2ML IJ SOLN: 4.0000 mg | Freq: Four times a day (QID) | INTRAMUSCULAR | Status: DC | PRN

## 2015-02-23 MED ORDER — OXYCODONE-ACETAMINOPHEN 5-325 MG PO TABS: 1.0000 | ORAL_TABLET | ORAL | Status: DC | PRN

## 2015-02-23 MED ORDER — OXYTOCIN BOLUS FROM INFUSION: 500.0000 mL | INTRAVENOUS | Status: DC

## 2015-02-23 MED ORDER — LACTATED RINGERS IV SOLN
500.0000 mL | INTRAVENOUS | Status: DC | PRN
  Administered 2015-02-23: 500 mL via INTRAVENOUS

## 2015-02-23 MED ORDER — PHENYLEPHRINE 40 MCG/ML (10ML) SYRINGE FOR IV PUSH (FOR BLOOD PRESSURE SUPPORT): 80.0000 ug | PREFILLED_SYRINGE | INTRAVENOUS | Status: DC | PRN

## 2015-02-23 MED ORDER — PHENYLEPHRINE 40 MCG/ML (10ML) SYRINGE FOR IV PUSH (FOR BLOOD PRESSURE SUPPORT)
80.0000 ug | PREFILLED_SYRINGE | INTRAVENOUS | Status: DC | PRN
  Filled 2015-02-23: qty 20
  Filled 2015-02-23: qty 2

## 2015-02-23 MED ORDER — LIDOCAINE HCL (PF) 1 % IJ SOLN
INTRAMUSCULAR | Status: DC | PRN
  Administered 2015-02-23: 3 mL
  Administered 2015-02-23: 4 mL

## 2015-02-23 MED ORDER — FENTANYL 2.5 MCG/ML BUPIVACAINE 1/10 % EPIDURAL INFUSION (WH - ANES)
12.0000 mL/h | INTRAMUSCULAR | Status: DC | PRN
Start: 2015-02-23 — End: 2015-02-24
  Administered 2015-02-23: 12 mL/h via EPIDURAL

## 2015-02-23 MED ORDER — DIPHENHYDRAMINE HCL 50 MG/ML IJ SOLN
12.5000 mg | INTRAMUSCULAR | Status: DC | PRN
  Administered 2015-02-24 (×2): 12.5 mg via INTRAVENOUS
  Filled 2015-02-23 (×2): qty 1

## 2015-02-23 MED ORDER — LACTATED RINGERS IV SOLN
INTRAVENOUS | Status: DC
  Administered 2015-02-23 – 2015-02-24 (×2): via INTRAVENOUS

## 2015-02-23 MED ORDER — PENICILLIN G POTASSIUM 5000000 UNITS IJ SOLR
5.0000 10*6.[IU] | Freq: Once | INTRAVENOUS | Status: AC
  Administered 2015-02-23: 5 10*6.[IU] via INTRAVENOUS
  Filled 2015-02-23: qty 5

## 2015-02-23 MED ORDER — DEXTROSE 5 % IV SOLN
2.5000 10*6.[IU] | INTRAVENOUS | Status: DC
  Administered 2015-02-24: 2.5 10*6.[IU] via INTRAVENOUS
  Filled 2015-02-23 (×4): qty 2.5

## 2015-02-23 MED ORDER — OXYTOCIN 40 UNITS IN LACTATED RINGERS INFUSION - SIMPLE MED
62.5000 mL/h | INTRAVENOUS | Status: DC
  Administered 2015-02-24: 999 mL/h via INTRAVENOUS
  Filled 2015-02-23: qty 1000

## 2015-02-23 MED ORDER — EPHEDRINE 5 MG/ML INJ
10.0000 mg | INTRAVENOUS | Status: DC | PRN
  Filled 2015-02-23: qty 2

## 2015-02-23 MED ORDER — FENTANYL CITRATE (PF) 100 MCG/2ML IJ SOLN: 100.0000 ug | INTRAMUSCULAR | Status: DC | PRN

## 2015-02-23 MED ORDER — OXYCODONE-ACETAMINOPHEN 5-325 MG PO TABS: 2.0000 | ORAL_TABLET | ORAL | Status: DC | PRN

## 2015-02-23 NOTE — MAU Note (Signed)
Pt reports leaking fluid x 4 hours, contractions.

## 2015-02-23 NOTE — Anesthesia Procedure Notes (Signed)
Epidural Patient location during procedure: OB Start time: 02/23/2015 11:45 PM  Staffing Anesthesiologist: Mal AmabileFOSTER, Shakeda Pearse Performed by: anesthesiologist   Preanesthetic Checklist Completed: patient identified, site marked, surgical consent, pre-op evaluation, timeout performed, IV checked, risks and benefits discussed and monitors and equipment checked  Epidural Patient position: sitting Prep: site prepped and draped and DuraPrep Patient monitoring: continuous pulse ox and blood pressure Approach: midline Location: L3-L4 Injection technique: LOR air  Needle:  Needle type: Tuohy  Needle gauge: 17 G Needle length: 9 cm and 9 Needle insertion depth: 4 cm Catheter type: closed end flexible Catheter size: 19 Gauge Catheter at skin depth: 9 cm Test dose: negative and Other  Assessment Events: blood not aspirated, injection not painful, no injection resistance, negative IV test and no paresthesia  Additional Notes Patient identified. Risks and benefits discussed including failed block, incomplete  Pain control, post dural puncture headache, nerve damage, paralysis, blood pressure Changes, nausea, vomiting, reactions to medications-both toxic and allergic and post Partum back pain. All questions were answered. Patient expressed understanding and wished to proceed. Sterile technique was used throughout procedure. Epidural site was Dressed with sterile barrier dressing. No paresthesias, signs of intravascular injection Or signs of intrathecal spread were encountered.  Patient was more comfortable after the epidural was dosed. Please see RN's note for documentation of vital signs and FHR which are stable.

## 2015-02-23 NOTE — Anesthesia Preprocedure Evaluation (Signed)
Anesthesia Evaluation  Patient identified by MRN, date of birth, ID band Patient awake    Reviewed: Allergy & Precautions, Patient's Chart, lab work & pertinent test results  Airway Mallampati: II  TM Distance: >3 FB Neck ROM: Full    Dental no notable dental hx. (+) Teeth Intact   Pulmonary neg pulmonary ROS,  breath sounds clear to auscultation  Pulmonary exam normal       Cardiovascular negative cardio ROS Normal cardiovascular examRhythm:Regular Rate:Normal     Neuro/Psych Anxiety negative neurological ROS     GI/Hepatic Neg liver ROS, GERD-  Medicated and Controlled,  Endo/Other  negative endocrine ROS  Renal/GU negative Renal ROS  negative genitourinary   Musculoskeletal negative musculoskeletal ROS (+)   Abdominal   Peds  Hematology  (+) anemia ,   Anesthesia Other Findings   Reproductive/Obstetrics (+) Pregnancy 36 weeks                             Anesthesia Physical Anesthesia Plan  ASA: II  Anesthesia Plan: Epidural   Post-op Pain Management:    Induction:   Airway Management Planned: Natural Airway  Additional Equipment:   Intra-op Plan:   Post-operative Plan:   Informed Consent: I have reviewed the patients History and Physical, chart, labs and discussed the procedure including the risks, benefits and alternatives for the proposed anesthesia with the patient or authorized representative who has indicated his/her understanding and acceptance.     Plan Discussed with: Anesthesiologist  Anesthesia Plan Comments:         Anesthesia Quick Evaluation

## 2015-02-23 NOTE — H&P (Signed)
LABOR ADMISSION HISTORY AND PHYSICAL  Tracy Hanna is a 24 y.o. female 523P2002 with IUP at 7049w0d by US presenting for preterm labor onset. She began feeling contractions over the past couple of days but they "got really bad" tonight. She reports having a gush of clear thin fluid today followed by consistent leakage of fluid. She reports +FM, no VB, no blurry vision, headaches or peripheral edema, and RUQ pain.  She plans on breast feeding. She is unsure for birth control, considering OCPs vs. Depot injections.  Dating: By 8wk US --->  Estimated Date of Delivery: 03/23/15  Sono:   @[redacted]w[redacted]d , CWD, normal anatomy, cephalic presentation, 1814 g, 45% EFW  Prenatal History/Complications: Clinic  LR clinic Prenatal Labs  Dating  8 weeks Blood type:   A+  Genetic Screen Too late Antibody: Neg  Anatomic US  Nml @ 32 wks Rubella:  Immune  GTT Early:               Third trimester: 101 RPR:   Nonreactive  Flu vaccine  Declined HBsAg:   Negative  TDaP vaccine   01/26/15                                            Rhogam: HIV:   Nonreactive  GBS  Pos in urine                                            (For PCN allergy, check sensitivities) GBS: Pos in urine  Contraception  Pap: LGSIL/HGSIL   Baby Food  Breast   Circumcision  Outpatient clinic   [facility]   Support Person  [facility] ([facility])    OB History    Gravida Para Term Preterm AB TAB SAB Ectopic Multiple Living   3 2 2  0 0 0 0 0 0 2    1st pregnancy: PPROM but full term delivery 2nd pregnancy: preterm labor but full term delivery (recieved mag sulfate and BMZ course)  Past Medical History: Past Medical History  Diagnosis Date  . Anemia   . Ovarian cyst     with current pregnancy   . Anxiety AGE 37    NO MEDS CURRENTLY  . Infection     UTI WITH PREGNANCY  . Infection 2008    TRICH  . Infection     OCC yeast  . Premature dilatation of cervix during pregnancy 04/09/2012    3 cm    Past Surgical History: Past Surgical  History  Procedure Laterality Date  . No past surgeries      Obstetrical History: OB History    Gravida Para Term Preterm AB TAB SAB Ectopic Multiple Living   3 2 2  0 0 0 0 0 0 2     Social History: History   Social History  . Marital Status: Single    Spouse Name: N/A  . Number of Children: N/A  . Years of Education: 12   Occupational History  . CNA    Social History Main Topics  . Smoking status: Never Smoker   . Smokeless tobacco: Never Used  . Alcohol Use: No     Comment: OCC  . Drug Use: No  . Sexual Activity:    Partners: Male  Birth Control/ Protection: None   Other Topics Concern  . None   Social History Narrative    Family History: Family History  Problem Relation Age of Onset  . Other Neg Hx   . Hypertension Mother   . Asthma Father   . Hypertension Father   . Asthma Sister   . Early death Sister 5    ASTHMA  . Asthma Brother   . Asthma Maternal Aunt   . Arthritis Maternal Grandmother   . Diabetes Maternal Grandmother   . Hypertension Maternal Grandmother   . Arthritis Maternal Grandfather     Allergies: No Known Allergies  Prescriptions prior to admission  Medication Sig Dispense Refill Last Dose  . acetaminophen (TYLENOL) 500 MG tablet Take 250 mg by mouth every 6 (six) hours as needed for headache.   Past Week at Unknown time  . prenatal vitamin w/FE, FA (PRENATAL 1 + 1) 27-1 MG TABS tablet Take 1 tablet by mouth daily at 12 noon.   02/23/2015 at Unknown time    Review of Systems  Constitutional: Negative for fever and chills.  Eyes: Negative for blurred vision and double vision.  Respiratory: Negative for shortness of breath.   Cardiovascular: Negative for chest pain and leg swelling.  Gastrointestinal: Positive for abdominal pain. Negative for nausea and vomiting.  Genitourinary: Negative for dysuria.  Musculoskeletal: Positive for back pain.  Neurological: Negative for dizziness and headaches.  Also per HPI  BP 133/86 mmHg   Pulse 94  Temp(Src) 97.6 F (36.4 C)  Resp 18  SpO2 96%  LMP 05/20/2014 (Approximate) General appearance: alert and cooperative. Lungs: clear to auscultation bilaterally. Heart: regular rate and rhythm. Abdomen: soft, non-tender; normal bowel sounds; gravid Pelvic: 5 cm dilated Extremities: Homans sign is negative, no sign of DVT, edema Presentation: cephalic Fetal monitoringBaseline: 150 bpm, Variability: Good {> 6 bpm), Accelerations: Reactive and Decelerations: Absent Uterine activityFrequency: Every 2-3 minutes    Prenatal labs: ABO, Rh: A/POS/-- (05/11 1226) Antibody: NEG (05/11 1226) Rubella:   Immune RPR: NON REAC (05/11 1226)  HBsAg: NEGATIVE (05/11 1226)  HIV: NONREACTIVE (05/11 1226)  GBS:   unknown, but positive in urine 1 hr Glucola 101 Genetic screening: presented too late Anatomy US: normal  weeks  Prenatal Transfer Tool  Maternal Diabetes: No Genetic Screening: presented too late Maternal Ultrasounds/Referrals: Normal Fetal Ultrasounds or other Referrals:  None Maternal Substance Abuse:  No Significant Maternal Medications:  None Significant Maternal Lab Results: None  No results found for this or any previous visit (from the past 24 hour(s)).  Patient Active Problem List   Diagnosis Date Noted  . Encounter for supervision of other normal pregnancy in third trimester 01/26/2015  . Late prenatal care affecting pregnancy 01/26/2015  . Anxiety 03/18/2012    Assessment: Tracy Hanna is a 24 y.o. G3P2002 at [redacted]w[redacted]d here for onset of preterm labor  wks.  #Labor: Expectant management of NSVD. No augmentation at this time. Consider if indicated #Pain: Epidural planned #FWB: Cat 1 #ID: GBS unknown; GBS PCR sent  -treating with PCN as she was GBS positive this pregnancy in urine #MOF: Breast #MOC: OCPs vs Depot #Circ: Outpatient  Caryl Ada, DO 02/23/2015, 10:20 PM PGY-1, [facility]   I spoke with and examined patient and  agree with resident/PA/SNM's note and plan of care.  5/70/-2, vtx Cheral Marker, CNM, [facility] 02/24/2015 12:38 AM

## 2015-02-24 ENCOUNTER — Encounter: Payer: Medicaid Other | Admitting: Obstetrics and Gynecology

## 2015-02-24 ENCOUNTER — Encounter (HOSPITAL_COMMUNITY): Payer: Self-pay | Admitting: *Deleted

## 2015-02-24 DIAGNOSIS — O0933 Supervision of pregnancy with insufficient antenatal care, third trimester: Secondary | ICD-10-CM

## 2015-02-24 DIAGNOSIS — O99824 Streptococcus B carrier state complicating childbirth: Secondary | ICD-10-CM

## 2015-02-24 DIAGNOSIS — Z3A36 36 weeks gestation of pregnancy: Secondary | ICD-10-CM

## 2015-02-24 LAB — ABO/RH: ABO/RH(D): A POS

## 2015-02-24 LAB — RPR: RPR: NONREACTIVE

## 2015-02-24 MED ORDER — ONDANSETRON HCL 4 MG PO TABS: 4.0000 mg | ORAL_TABLET | ORAL | Status: DC | PRN

## 2015-02-24 MED ORDER — SENNOSIDES-DOCUSATE SODIUM 8.6-50 MG PO TABS
2.0000 | ORAL_TABLET | ORAL | Status: DC
  Administered 2015-02-24 – 2015-02-25 (×2): 2 via ORAL
  Filled 2015-02-24 (×2): qty 2

## 2015-02-24 MED ORDER — IBUPROFEN 600 MG PO TABS
600.0000 mg | ORAL_TABLET | Freq: Four times a day (QID) | ORAL | Status: DC
  Administered 2015-02-24 – 2015-02-26 (×8): 600 mg via ORAL
  Filled 2015-02-24 (×10): qty 1

## 2015-02-24 MED ORDER — SIMETHICONE 80 MG PO CHEW: 80.0000 mg | CHEWABLE_TABLET | ORAL | Status: DC | PRN

## 2015-02-24 MED ORDER — HYDROXYZINE HCL 50 MG/ML IM SOLN
50.0000 mg | Freq: Four times a day (QID) | INTRAMUSCULAR | Status: DC | PRN
  Filled 2015-02-24: qty 1

## 2015-02-24 MED ORDER — BENZOCAINE-MENTHOL 20-0.5 % EX AERO
1.0000 "application " | INHALATION_SPRAY | CUTANEOUS | Status: DC | PRN
  Administered 2015-02-24: 1 via TOPICAL
  Filled 2015-02-24: qty 56

## 2015-02-24 MED ORDER — ZOLPIDEM TARTRATE 5 MG PO TABS: 5.0000 mg | ORAL_TABLET | Freq: Every evening | ORAL | Status: DC | PRN

## 2015-02-24 MED ORDER — TETANUS-DIPHTH-ACELL PERTUSSIS 5-2.5-18.5 LF-MCG/0.5 IM SUSP: 0.5000 mL | Freq: Once | INTRAMUSCULAR | Status: DC

## 2015-02-24 MED ORDER — PRENATAL MULTIVITAMIN CH
1.0000 | ORAL_TABLET | Freq: Every day | ORAL | Status: DC
  Administered 2015-02-24 – 2015-02-26 (×3): 1 via ORAL
  Filled 2015-02-24 (×3): qty 1

## 2015-02-24 MED ORDER — ONDANSETRON HCL 4 MG/2ML IJ SOLN: 4.0000 mg | INTRAMUSCULAR | Status: DC | PRN

## 2015-02-24 MED ORDER — WITCH HAZEL-GLYCERIN EX PADS: 1.0000 "application " | MEDICATED_PAD | CUTANEOUS | Status: DC | PRN

## 2015-02-24 MED ORDER — LANOLIN HYDROUS EX OINT: TOPICAL_OINTMENT | CUTANEOUS | Status: DC | PRN

## 2015-02-24 MED ORDER — DIBUCAINE 1 % RE OINT: 1.0000 "application " | TOPICAL_OINTMENT | RECTAL | Status: DC | PRN

## 2015-02-24 MED ORDER — DIPHENHYDRAMINE HCL 25 MG PO CAPS: 25.0000 mg | ORAL_CAPSULE | Freq: Four times a day (QID) | ORAL | Status: DC | PRN

## 2015-02-24 MED ORDER — HYDROXYZINE HCL 50 MG PO TABS
50.0000 mg | ORAL_TABLET | Freq: Four times a day (QID) | ORAL | Status: DC | PRN
  Administered 2015-02-24: 50 mg via ORAL
  Filled 2015-02-24 (×2): qty 1

## 2015-02-24 MED ORDER — ACETAMINOPHEN 325 MG PO TABS
650.0000 mg | ORAL_TABLET | ORAL | Status: DC | PRN
  Administered 2015-02-24 (×2): 650 mg via ORAL
  Filled 2015-02-24 (×3): qty 2

## 2015-02-24 NOTE — Lactation Note (Signed)
This note was copied from the chart of Tracy Shoshanna Corban. Lactation Consultation Note   Initial consult with this mom of a 36 week NICU baby, with resp distress and r/o sepsis. Mom is an experienced breast feeder. She had begun pumping in premie setting, and I reviewed hand expression with mom - she express drops of clear colostrum. Mom very receptive to teaching. Lactation services also reviewed. Mom to apply to [facility] - fax sent, and paper work for loaner pump given to mom. Mom encouraged to pump/hand express every 2-3 hours, and to sleep 5 hours at night. Mom knows to call for questions/concerns.   Patient Name: Tracy Hanna TIWPY'K Date: 02/24/2015 Reason for consult: Initial assessment   Maternal Data Formula Feeding for Exclusion: Yes (baby in NICU) Has patient been taught Hand Expression?: Yes Does the patient have breastfeeding experience prior to this delivery?: Yes  Feeding    LATCH Score/Interventions                      Lactation Tools Discussed/Used WIC Program: Yes (fax sent for mom ot apply, will need [facility] loaner, mom given paper work for DEP) Pump Review: Setup, frequency, and cleaning;Milk Storage;Other (comment) (hand exp, NICU booklet, premie seting) Initiated by:: bedside RN  Date initiated:: 02/24/15   Consult Status Consult Status: Follow-up Date: 02/25/15 Follow-up type: In-patient    Alfred Levins 02/24/2015, 4:35 PM

## 2015-02-25 MED ORDER — OXYCODONE-ACETAMINOPHEN 5-325 MG PO TABS
1.0000 | ORAL_TABLET | ORAL | Status: DC | PRN
  Administered 2015-02-25: 2 via ORAL
  Administered 2015-02-25: 1 via ORAL
  Administered 2015-02-25 – 2015-02-26 (×3): 2 via ORAL
  Filled 2015-02-25: qty 1
  Filled 2015-02-25 (×2): qty 2
  Filled 2015-02-25: qty 1
  Filled 2015-02-25: qty 2
  Filled 2015-02-25: qty 1

## 2015-02-25 NOTE — Progress Notes (Signed)
Post Partum Day 1 Subjective: up ad lib, voiding, tolerating PO and + flatus  Reports moderate pain with cramping, improved with percocet but not ibuprofen. Baby is in NICU. Working on pumping   Objective: Blood pressure 120/66, pulse 86, temperature 98.2 F (36.8 C), temperature source Oral, resp. rate 20, height 5' 3.5" (1.613 m), weight 75.751 kg (167 lb), last menstrual period 05/20/2014, SpO2 98 %, unknown if currently breastfeeding.  Physical Exam:  General: alert, cooperative and no distress  Lungs: CTAB CV: RRR no m/r/g Lochia: appropriate Uterine Fundus: firm DVT Evaluation: No cords or calf tenderness. No significant calf/ankle edema.   Recent Labs  02/23/15 2300  HGB 10.9*  HCT 33.1*    Assessment/Plan: 24 y.o. [id number] after NSVD at [redacted]w[redacted]d. Doing well on postpartum day #1. Moderate pain requiring percocet overnight. Infant is in NICU and mom is giving expressed breastmilk.  Plan for discharge in the AM.  Plan for Depo for birthcontrol.    LOS: 2 days   Fabio Asa 02/25/2015, 7:49 AM

## 2015-02-25 NOTE — Clinical Social Work Maternal (Signed)
CLINICAL SOCIAL WORK MATERNAL/CHILD NOTE  Patient Details  Name: Tracy Hanna MRN: [id number] Date of Birth: 02/02/1991  Date:  02/25/2015  Clinical Social Worker Initiating Note:  Colleen E. Shaw, LCSW Date/ Time Initiated:  02/25/15/1030     Child's Name:  Tracy Hanna   Legal Guardian:   (Parents: Tracy Hanna and Tracy Hanna)   Need for Interpreter:  None   Date of Referral:        Reason for Referral:   (No referral-NICU admission.  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  CSW notes that patient's first PNV was at 32 weeks and has a documented history of Anxiety.  MOB was pleasant and welcomed CSW into the room.  She reports that she has just awoken and that now would be a good time to talk.  CSW explained role in the hospital and ongoing support services while baby is in the NICU.  MOB acknowledged sadness that baby had to be taken to NICU and now separated from her, but states she understands that it is what is best for him at this time.  MOB has two  other children, girls ages 7 and 2, who were not admitted to NICU.  She states her parents are caring for her daughters while she is in the hospital.  MOB understands that she will be discharged prior to baby, most likely, and states no issues with transportation.  FOB was asleep on the couch during assessment.  MOB states they are in a relationship and that he is the father of all her children.  MOB explained that her late PNC was due to issues getting Medicaid approved.  CSW explained hospital drug screen policy.  MOB stated understanding and no concerns.  Baby's UDS is negative.  CSW inquired about MOB's postpartum periods after her previous births and MOB reports some heightened emotions after her second baby, but nothing requiring medication or counseling.  She states she feels she coped well and the symptoms went away after a couple weeks.  MOB reports no mental health concerns currently.  CSW provided education on PPD signs and symptoms and asked that MOB talk with CSW and or her doctor if she has concerns at any time.  MOB agreed and acknowledged the seriousness of PPD.  Patient states she has many baby supplies already, but does not have a car seat or bed for baby at this time.  She states her baby shower   is planned for 03/16/15.  She reports getting a car seat from the [employer] program and therefore CSW explained that she will not be eligible for another one.  She states she will be able to get a car seat prior to discharge.  CSW offered assistance with getting a baby bed through [employer].  MOB agreed and thanked CSW.  CSW made referral.  CSW provided contact information and asked MOB to call if she has any questions, concerns or needs at any time.  CSW Plan/Description:  Patient/Family Education , Psychosocial Support and Ongoing Assessment of Needs    Alphonzo Cruise, [place] 02/25/2015, 2:19 PM

## 2015-02-25 NOTE — Anesthesia Postprocedure Evaluation (Signed)
Anesthesia Post Note  Patient: Tracy Hanna  Procedure(s) Performed: * No procedures listed *  Anesthesia type: Epidural  Patient location: Mother/Baby  Post pain: Pain level controlled  Post assessment: Post-op Vital signs reviewed  Last Vitals:  Filed Vitals:   02/25/15 0515  BP: 120/66  Pulse: 86  Temp: 36.8 C  Resp: 20    Post vital signs: Reviewed  Level of consciousness:alert  Complications: No apparent anesthesia complications

## 2015-02-25 NOTE — Lactation Note (Signed)
This note was copied from the chart of Tracy Skylier Lanthier. Lactation Consultation Note  Patient Name: Tracy Hanna IFOYD'X Date: 02/25/2015 Reason for consult: Follow-up assessment;NICU baby;Other (Comment) (planning for D/C today )  Spoke with mom this am , per mom hasn't called WIC yet for a pump loaner. LC recommended she call to set up an Apt. Mom has the paperwork for [facility] loaner form [employer] , but will need an apt set up 1st. Mom aware. LC faxed a the [facility] loaner request this am.  Per mom has D/C PW  Mom concerned with EBM yield. Per mom has been pumping every 2 hours with some yield.  According to the [facility] RN mom has been up to NICU and hasn't been consistent pumping.    Maternal Data    Feeding    LATCH Score/Interventions                      Lactation Tools Discussed/Used WIC Program: No   Consult Status Consult Status: Follow-up Date: 02/25/15 Follow-up type: In-patient    Kathrin Greathouse 02/25/2015, 9:14 AM

## 2015-02-26 ENCOUNTER — Ambulatory Visit: Payer: Self-pay

## 2015-02-26 NOTE — Discharge Instructions (Signed)

## 2015-02-26 NOTE — Discharge Summary (Signed)
Obstetric Discharge Summary Reason for Admission: onset of labor Prenatal Procedures: none Intrapartum Procedures: spontaneous vaginal delivery Postpartum Procedures: none Complications-Operative and Postpartum: none   Delivery Note At 8:14 AM a viable female was delivered via Vaginal, Spontaneous Delivery (Presentation: Left Occiput Anterior). APGAR: 8, 9; weight .  Placenta status: Intact, Spontaneous. Cord: 3 vessels with the following complications: None.   Anesthesia: Epidural  Episiotomy: None Lacerations: None Suture Repair: none Est. Blood Loss (mL): 68  Mom to postpartum. Baby to Couplet care / Skin to Skin.  Tracy Hanna 02/24/2015, 8:44 AM  HEMOGLOBIN  Date Value Ref Range Status  02/23/2015 10.9* 12.0 - 15.0 g/dL Final   HCT  Date Value Ref Range Status  02/23/2015 33.1* 36.0 - 46.0 % Final    Physical Exam:  General: alert and cooperative Lochia: appropriate Uterine Fundus: firm Incision:  DVT Evaluation: No evidence of DVT seen on physical exam.  Discharge Diagnoses: Preterm Delivery  Discharge Information: Date: 02/26/2015 Activity: unrestricted Diet: routine Medications: None Condition: stable  Discharge to: home and Will be rooming in Follow-up Information    Follow up with [facility] In 6 weeks.   Specialty:  Obstetrics and Gynecology   Why:  Call office on Monday to schedule appointment   Contact information:   [address] [place] [address] [phone]      Newborn Data: Live born female  Birth Weight: 6 lb 2.6 oz (2795 g) APGAR: 8, 9   in [place].  Tracy Hanna 02/26/2015, 1:58 PM

## 2015-02-26 NOTE — Lactation Note (Signed)
This note was copied from the chart of Tracy Hanna. Lactation Consultation Note  Patient Name: Tracy Hanna [id number] Date: 02/26/2015 Reason for consult: Follow-up assessment   With this mom of a 36 + week NICU baby. Mom is teary eyed because she is being discharged to home today. She is enogrged, so I got her cie and revewed engorgement care with her. I had mom pump with DEP, one breast at a time, and add compression, after icing ofr 20 minutes. Mom was still only able to gett a small amount of milk. Her milk would flow in a strong stream, and then stop. Mom to try massaging her breasts toward her arpits, when home. She loaned a WIC DEP, and goed to [facility] on Monday, 6/13 Mom knows to ask her baby's nurse for lactation help as needed.    Maternal Data    Feeding Feeding Type: Formula Length of feed: 15 min  LATCH Score/Interventions                      Lactation Tools Discussed/Used WIC Program: Yes   Consult Status Consult Status: PRN Follow-up type: In-patient (NICU)    Alfred Levins 02/26/2015, 4:55 PM

## 2015-03-11 ENCOUNTER — Encounter (HOSPITAL_COMMUNITY): Payer: Medicaid Other

## 2015-04-04 ENCOUNTER — Ambulatory Visit: Payer: Medicaid Other | Admitting: Certified Nurse Midwife

## 2015-04-13 ENCOUNTER — Telehealth: Payer: Self-pay | Admitting: General Practice

## 2015-04-13 ENCOUNTER — Encounter: Payer: Self-pay | Admitting: General Practice

## 2015-04-13 NOTE — Telephone Encounter (Signed)
Per chart review, patient needs colposcopy. Called patient at only listed number and number has been disconnected. Will send letter

## 2015-06-18 ENCOUNTER — Encounter (HOSPITAL_BASED_OUTPATIENT_CLINIC_OR_DEPARTMENT_OTHER): Payer: Self-pay | Admitting: Emergency Medicine

## 2015-06-18 ENCOUNTER — Emergency Department (HOSPITAL_BASED_OUTPATIENT_CLINIC_OR_DEPARTMENT_OTHER)
Admission: EM | Admit: 2015-06-18 | Discharge: 2015-06-18 | Disposition: A | Discharge status: Home / Self Care | Payer: Medicaid Other | Attending: Emergency Medicine | Admitting: Emergency Medicine

## 2015-06-18 DIAGNOSIS — Z8619 Personal history of other infectious and parasitic diseases: Secondary | ICD-10-CM | POA: Insufficient documentation

## 2015-06-18 DIAGNOSIS — H109 Unspecified conjunctivitis: Secondary | ICD-10-CM | POA: Diagnosis not present

## 2015-06-18 DIAGNOSIS — Z87440 Personal history of urinary (tract) infections: Secondary | ICD-10-CM | POA: Diagnosis not present

## 2015-06-18 DIAGNOSIS — H578 Other specified disorders of eye and adnexa: Secondary | ICD-10-CM | POA: Diagnosis present

## 2015-06-18 DIAGNOSIS — Z79899 Other long term (current) drug therapy: Secondary | ICD-10-CM | POA: Insufficient documentation

## 2015-06-18 DIAGNOSIS — Z8742 Personal history of other diseases of the female genital tract: Secondary | ICD-10-CM | POA: Diagnosis not present

## 2015-06-18 DIAGNOSIS — Z862 Personal history of diseases of the blood and blood-forming organs and certain disorders involving the immune mechanism: Secondary | ICD-10-CM | POA: Insufficient documentation

## 2015-06-18 MED ORDER — SULFACETAMIDE SODIUM 10 % OP SOLN: 1.0000 [drp] | Freq: Four times a day (QID) | OPHTHALMIC | Status: DC

## 2015-06-18 NOTE — ED Notes (Signed)
Redness, exudate, and pain in right eye x3 days.

## 2015-06-18 NOTE — ED Provider Notes (Signed)
CSN: [id number]     Arrival date & time 06/18/15  0013 History   First MD Initiated Contact with Patient 06/18/15 0139     Chief Complaint  Patient presents with  . Eye Pain     (Consider location/radiation/quality/duration/timing/severity/associated sxs/prior Treatment) HPI Comments: Patient is a 24 year old female who presents with bilateral eye burning, redness, and drainage. She states that her infant child had a similar illness last week. She denies any injury or trauma. She denies any fevers or chills.  Patient is a 24 y.o. female presenting with eye pain. The history is provided by the patient.  Eye Pain This is a new problem. The current episode started yesterday. The problem occurs constantly. The problem has been gradually worsening. Nothing aggravates the symptoms. Nothing relieves the symptoms.    Past Medical History  Diagnosis Date  . Anemia   . Ovarian cyst     with current pregnancy   . Anxiety AGE 56    NO MEDS CURRENTLY  . Infection     UTI WITH PREGNANCY  . Infection 2008    TRICH  . Infection     OCC yeast  . Premature dilatation of cervix during pregnancy 04/09/2012    3 cm   Past Surgical History  Procedure Laterality Date  . No past surgeries     Family History  Problem Relation Age of Onset  . Other Neg Hx   . Hypertension Mother   . Asthma Father   . Hypertension Father   . Asthma Sister   . Early death Sister 5    ASTHMA  . Asthma Brother   . Asthma Maternal Aunt   . Arthritis Maternal Grandmother   . Diabetes Maternal Grandmother   . Hypertension Maternal Grandmother   . Arthritis Maternal Grandfather    Social History  Substance Use Topics  . Smoking status: Never Smoker   . Smokeless tobacco: Never Used  . Alcohol Use: No     Comment: OCC   OB History    Gravida Para Term Preterm AB TAB SAB Ectopic Multiple Living   0 0 0 0 0 3     Review of Systems  Eyes: Positive for pain.  All other systems reviewed and are  negative.     Allergies  Review of patient's allergies indicates no known allergies.  Home Medications   Prior to Admission medications   Medication Sig Start Date End Date Taking? Authorizing Provider  acetaminophen (TYLENOL) 500 MG tablet Take 250 mg by mouth every 6 (six) hours as needed for headache.    Historical Provider, MD  prenatal vitamin w/FE, FA (PRENATAL 1 + 1) 27-1 MG TABS tablet Take 1 tablet by mouth daily at 12 noon.    Historical Provider, MD  sulfacetamide (BLEPH-10) 10 % ophthalmic solution Place 1-2 drops into both eyes 4 (four) times daily. 06/18/15   Geoffery Lyons, MD   BP 133/78 mmHg  Pulse 84  Temp(Src) 98.4 F (36.9 C) (Oral)  Resp 20  Ht  (1.626 m)  Wt 140 lb (63.504 kg)  BMI 24.02 kg/m2  SpO2 100%  LMP 05/26/2015 (Approximate) Physical Exam  Constitutional: She is oriented to person, place, and time. She appears well-developed and well-nourished. No distress.  HENT:  Head: Normocephalic and atraumatic.  Eyes:  The bilateral conjunctiva are injected with a purulent drainage from both eyes, the right greater than left.  Neck: Normal range of motion. Neck supple.  Neurological: She is  alert and oriented to person, place, and time.  Skin: Skin is warm and dry. She is not diaphoretic.  Nursing note and vitals reviewed.   ED Course  Procedures (including critical care time) Labs Review Labs Reviewed - No data to display  Imaging Review No results found. I have personally reviewed and evaluated these images and lab results as part of my medical decision-making.   EKG Interpretation None      MDM   Final diagnoses:  Bilateral conjunctivitis    Will treat with Bleph-10 for conjunctivitis. To return as needed for any problems.    Geoffery Lyons, MD 06/18/15 [phone]

## 2015-06-18 NOTE — ED Notes (Signed)
Patient states during visual acuity that everything is really blurry. Patient took a second to refocus her eyes on the board to attempt to see the board better with no luck. Even when walking back to patient room patient states even up close your face is blurry to me.

## 2015-06-18 NOTE — Discharge Instructions (Signed)
Bleph-10 eyedrops as prescribed.  Return to the emergency department if symptoms significantly worsen or change.   Conjunctivitis Conjunctivitis is commonly called "pink eye." Conjunctivitis can be caused by bacterial or viral infection, allergies, or injuries. There is usually redness of the lining of the eye, itching, discomfort, and sometimes discharge. There may be deposits of matter along the eyelids. A viral infection usually causes a watery discharge, while a bacterial infection causes a yellowish, thick discharge. Pink eye is very contagious and spreads by direct contact. You may be given antibiotic eyedrops as part of your treatment. Before using your eye medicine, remove all drainage from the eye by washing gently with warm water and cotton balls. Continue to use the medication until you have awakened 2 mornings in a row without discharge from the eye. Do not rub your eye. This increases the irritation and helps spread infection. Use separate towels from other household members. Wash your hands with soap and water before and after touching your eyes. Use cold compresses to reduce pain and sunglasses to relieve irritation from light. Do not wear contact lenses or wear eye makeup until the infection is gone. SEEK MEDICAL CARE IF:   Your symptoms are not better after 3 days of treatment.  You have increased pain or trouble seeing.  The outer eyelids become very red or swollen. Document Released: 10/11/2004 Document Revised: 11/26/2011 Document Reviewed: 09/03/2005 [facility] Patient Information 2015 [place], [place]. This information is not intended to replace advice given to you by your health care provider. Make sure you discuss any questions you have with your health care provider.

## 2015-06-18 NOTE — ED Notes (Signed)
C/o eyes itching, drainage, redness x 3 days  Right worse than left

## 2016-05-19 IMAGING — US US OB COMP LESS 14 WK
1 series · 14 of 20 positions shown · non-contrast
Comparison: None.

ADDENDUM:
This exam was transabdominal only. Transvaginal component was
canceled.
CLINICAL DATA: Vaginal bleeding and pregnancy, first trimester.

EXAM:
OBSTETRIC <14 WK US AND TRANSVAGINAL OB US
TECHNIQUE: Both transabdominal and transvaginal ultrasound examinations were
performed for complete evaluation of the gestation as well as the
maternal uterus, adnexal regions, and pelvic cul-de-sac.
Transvaginal technique was performed to assess early pregnancy.

[Series 1: us ob comp less 14 wks · 14 of 20 slices shown]
[im 1/20]
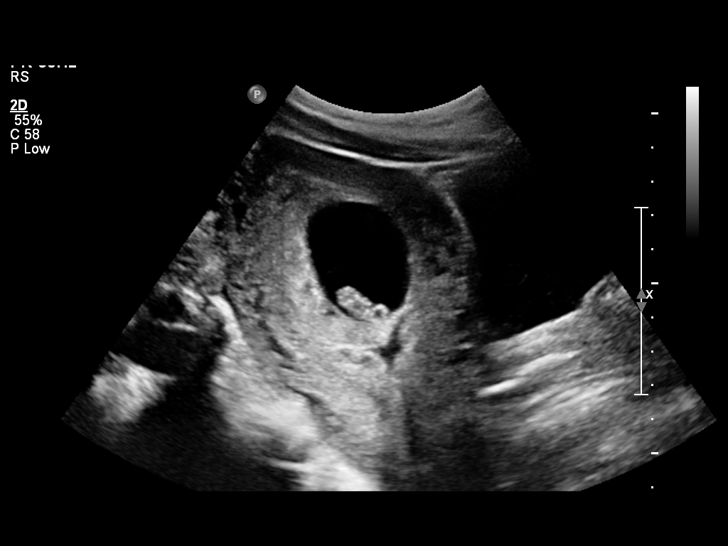
[im 3/20]
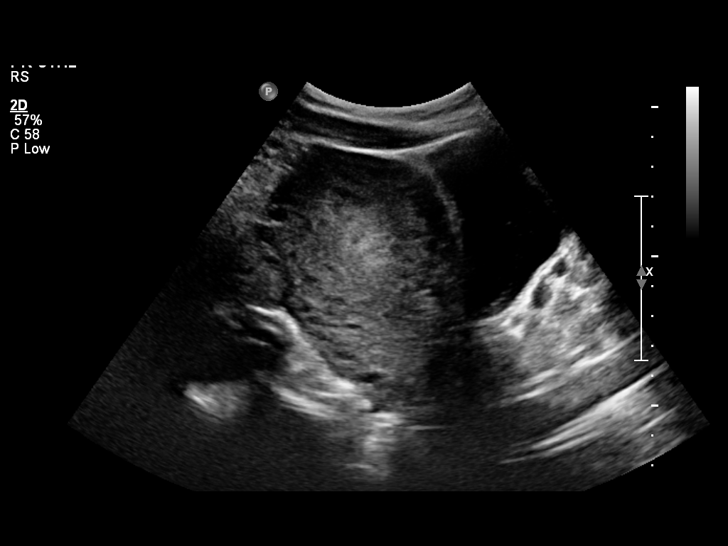
[im 4/20]
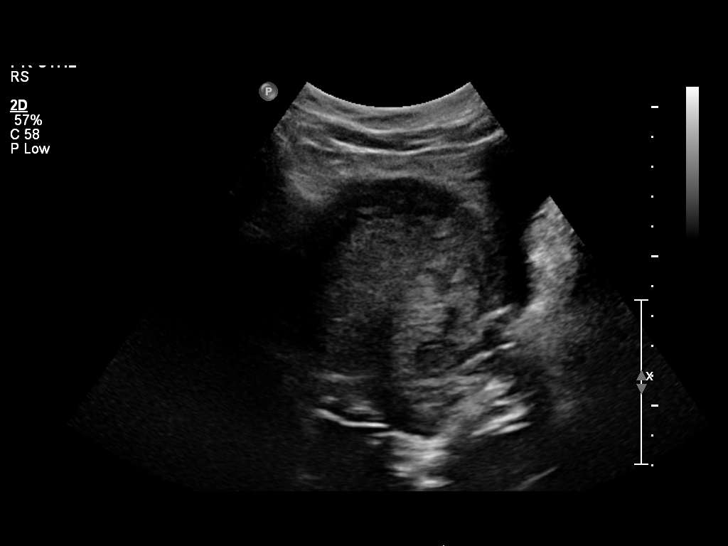
[im 6/20]
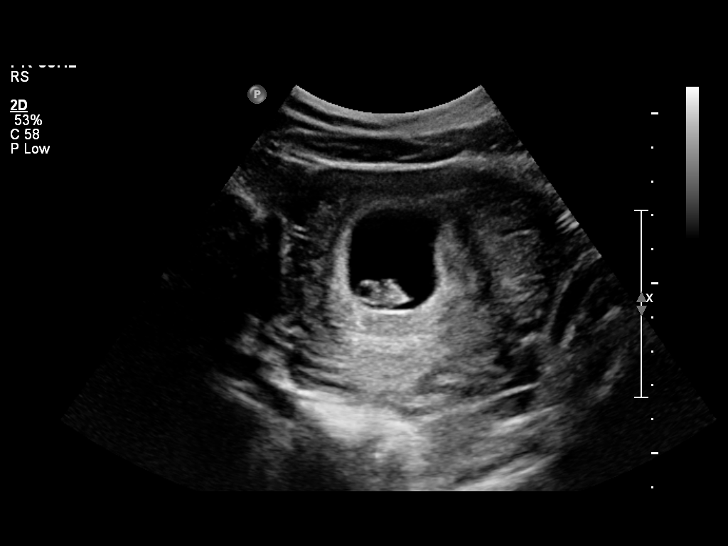
[im 7/20]
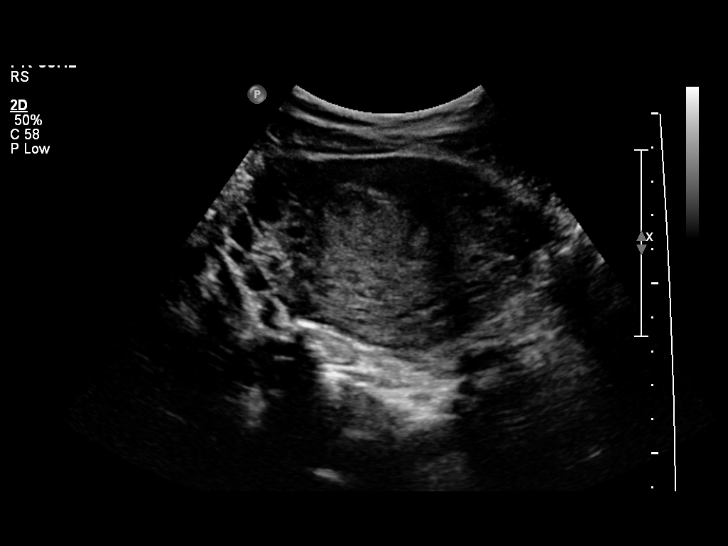
[im 8/20]
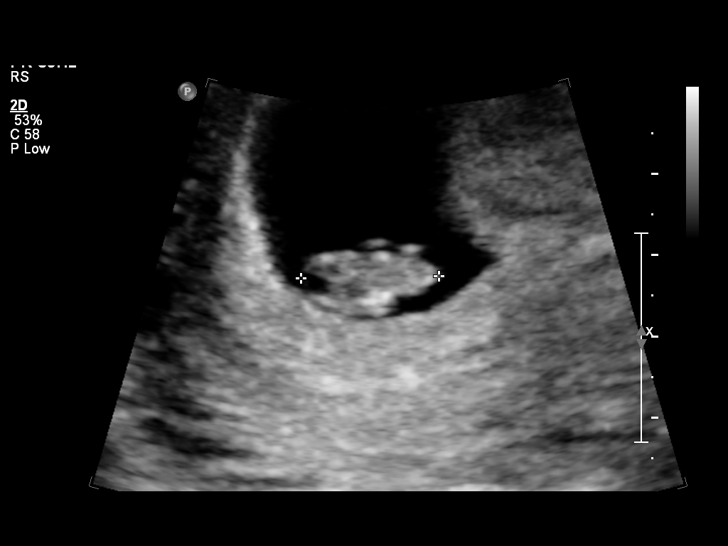
[im 10/20]
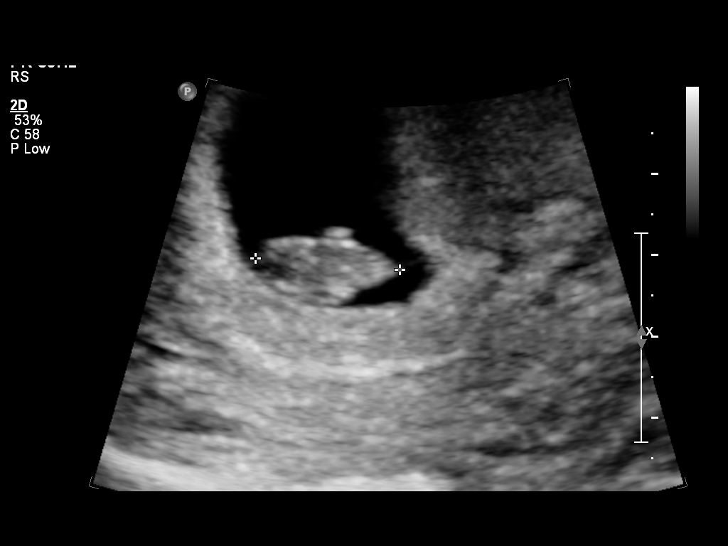
[im 11/20]
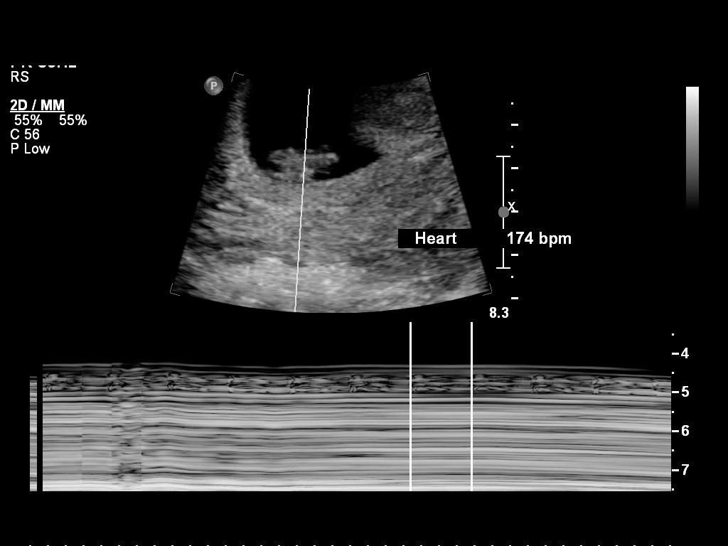
[im 13/20]
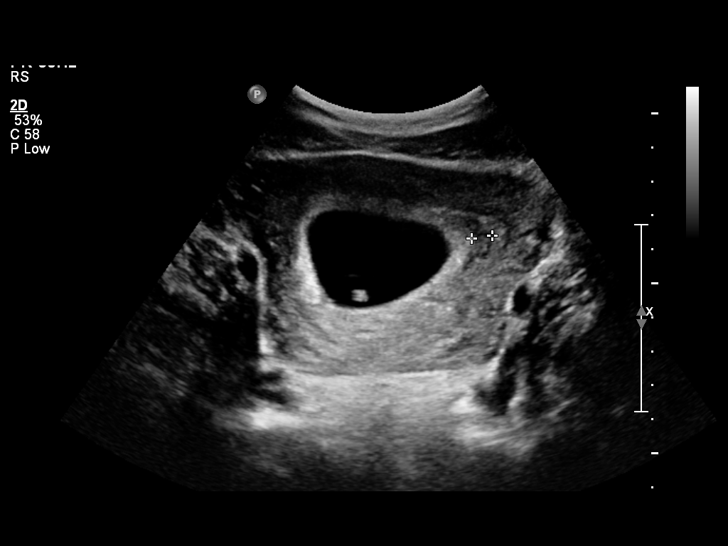
[im 14/20]
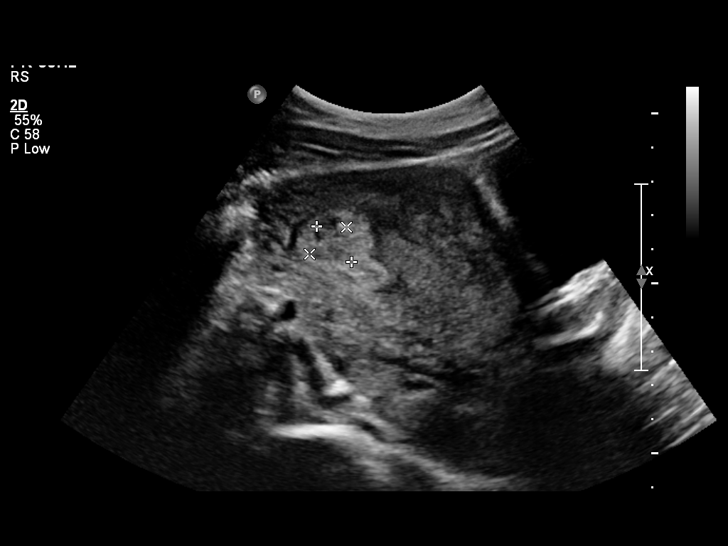
[im 16/20]
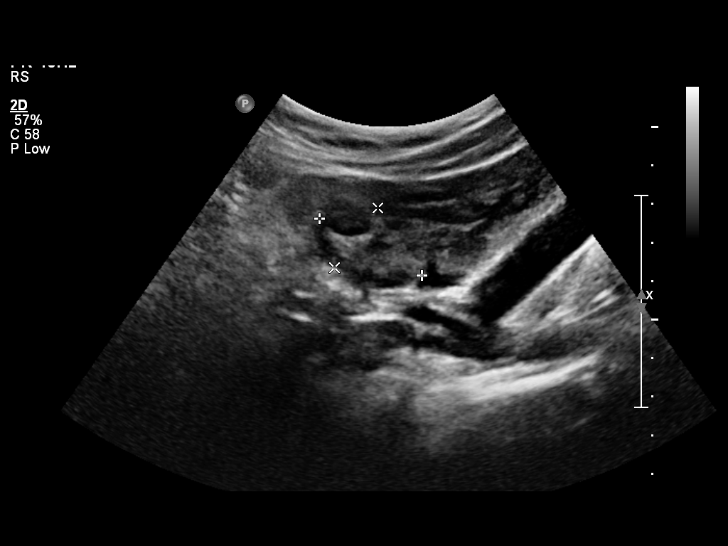
[im 17/20]
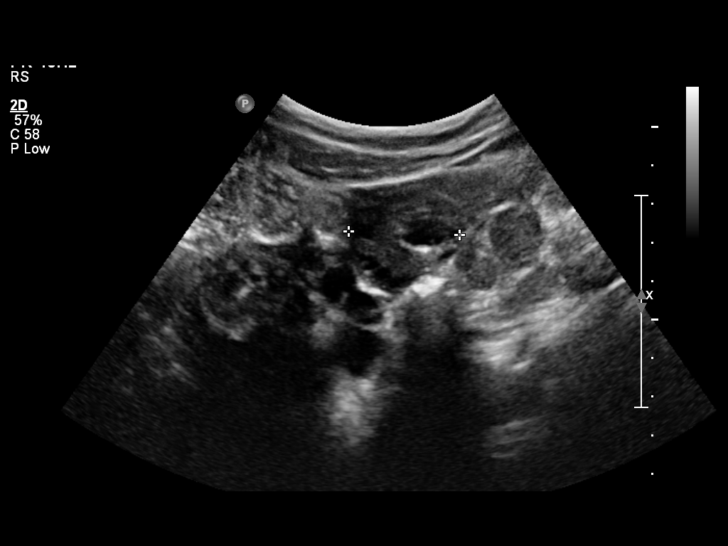
[im 18/20]
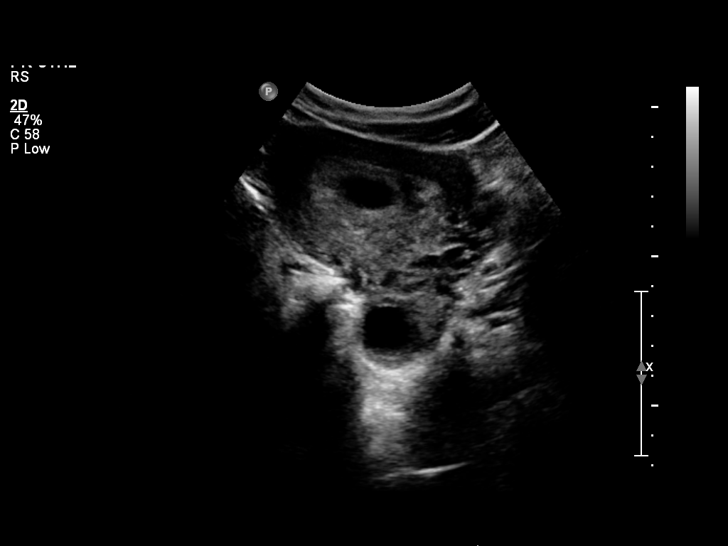
[im 20/20]
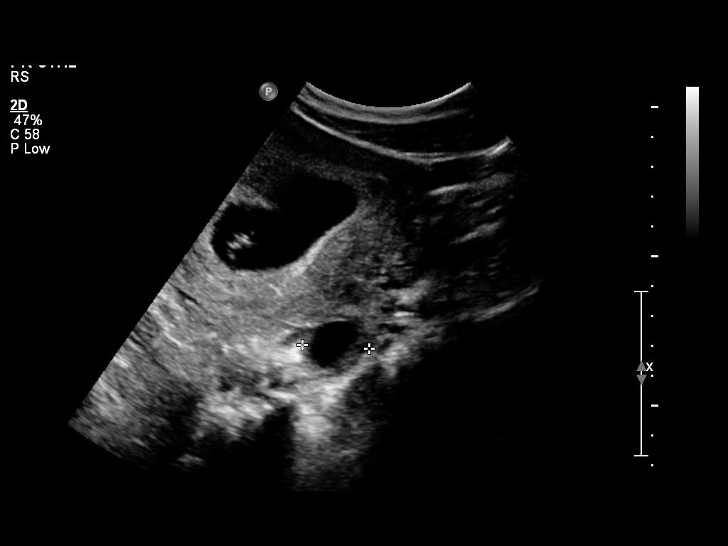

[14 of 20 positions shown; findings below may reference images not displayed]

FINDINGS: Intrauterine gestational sac: Visualized/normal in shape.

Yolk sac:  Visualized.

Embryo:  Visualized.

Cardiac Activity: Visualized.

Heart Rate:  174 bpm

CRL:   17.5   mm   8 w 2 d                  US EDC: March 23, 2015.

Maternal uterus/adnexae: Both ovaries appear normal, with corpus
luteum cyst seen in left ovary. No free fluid is noted. Small
subchorionic hemorrhage is noted.
IMPRESSION: Single live intrauterine gestation of 8 weeks 2 days. Small
subchorionic hemorrhage is noted.

## 2016-11-03 IMAGING — US US OB COMP +14 WK
1 series · 12 of 28 positions shown · non-contrast
Comparison: none

[Series 1: us ob +14 all · 100 acquisitions, 12 frames shown]
[im 4/100]
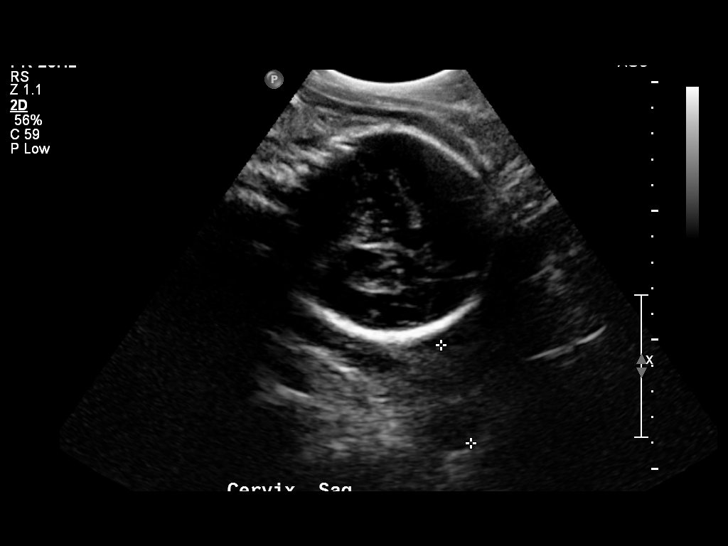
[im 12/100]
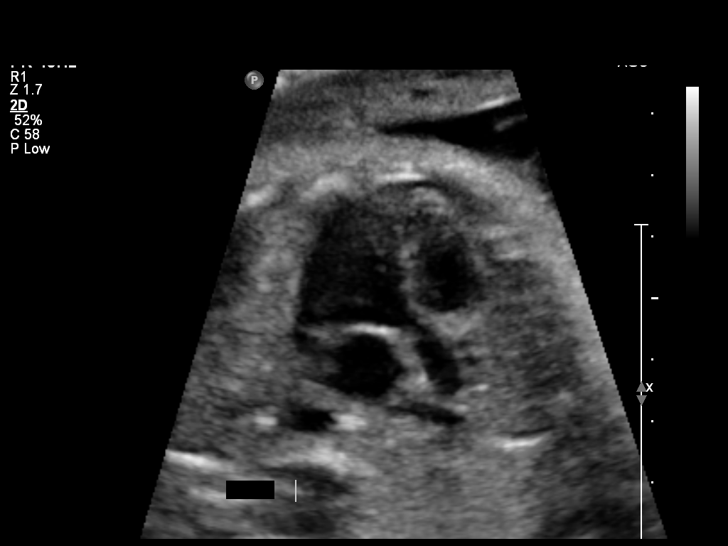
[im 19/100]
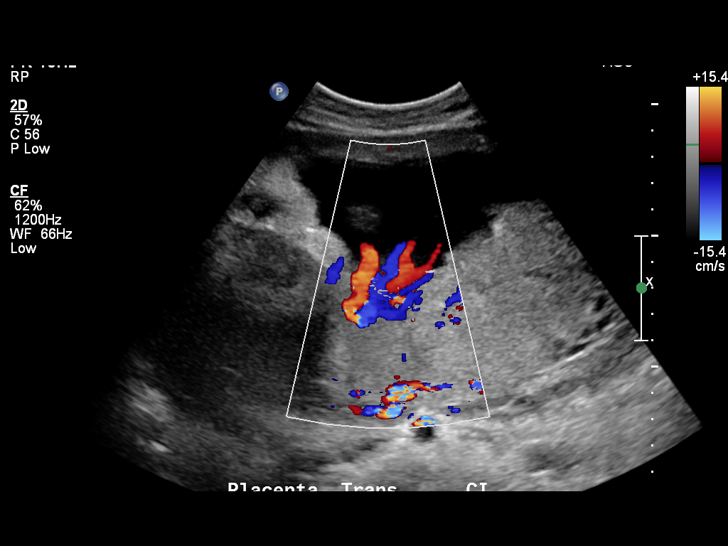
[im 30/100]
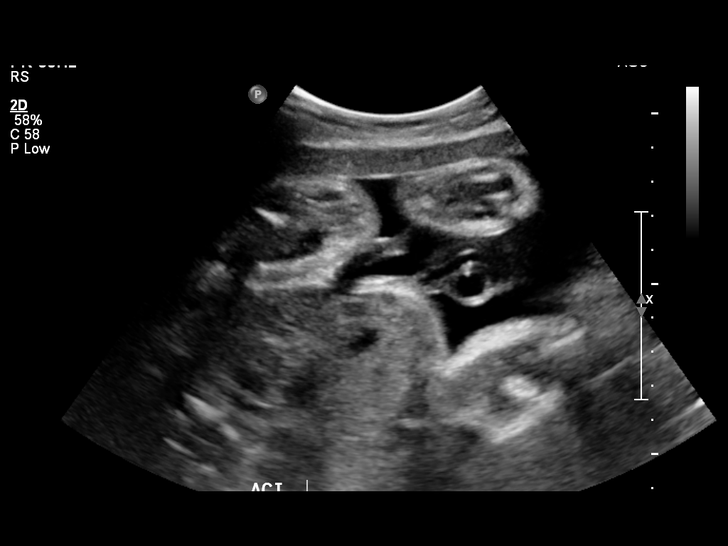
[im 37/100]
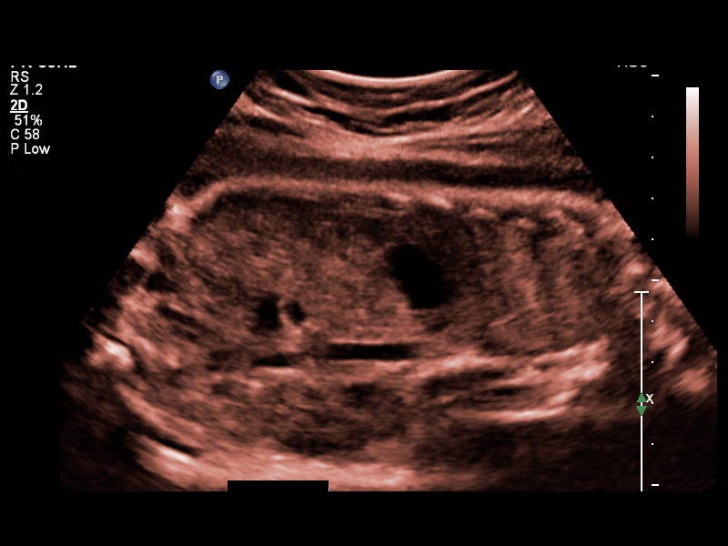
[im 45/100]
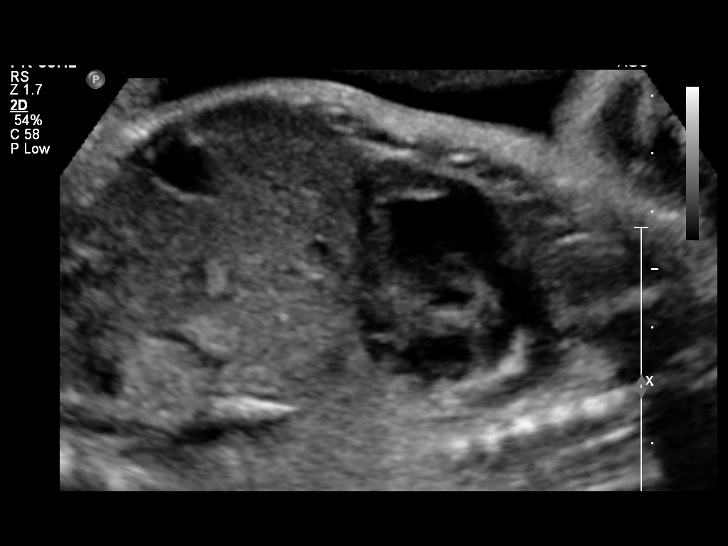
[im 56/100]
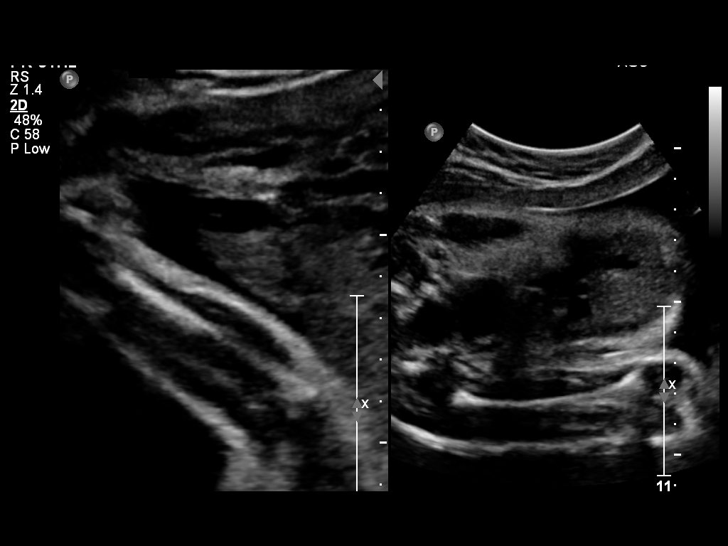
[im 63/100]
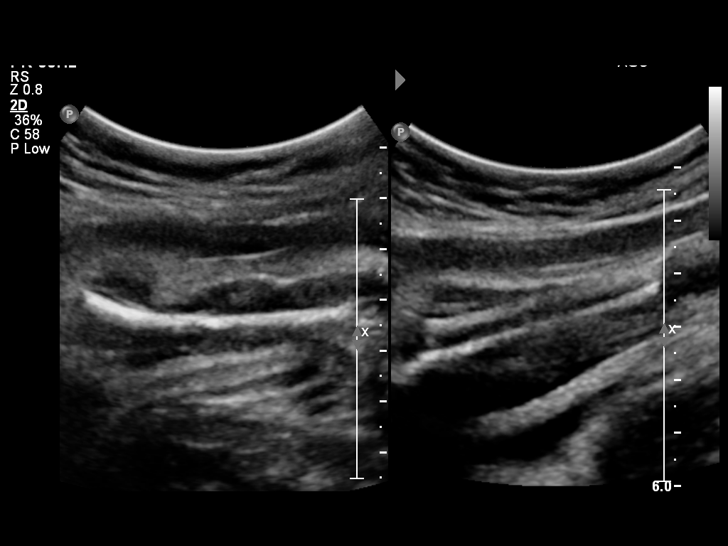
[im 70/100]
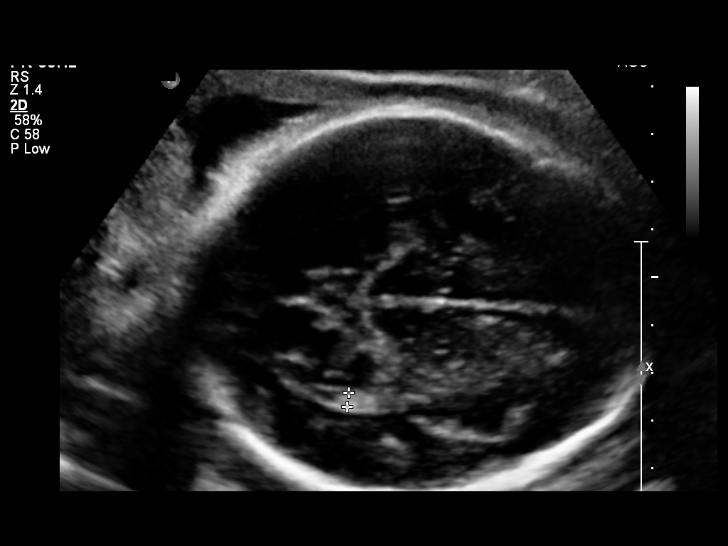
[im 81/100]
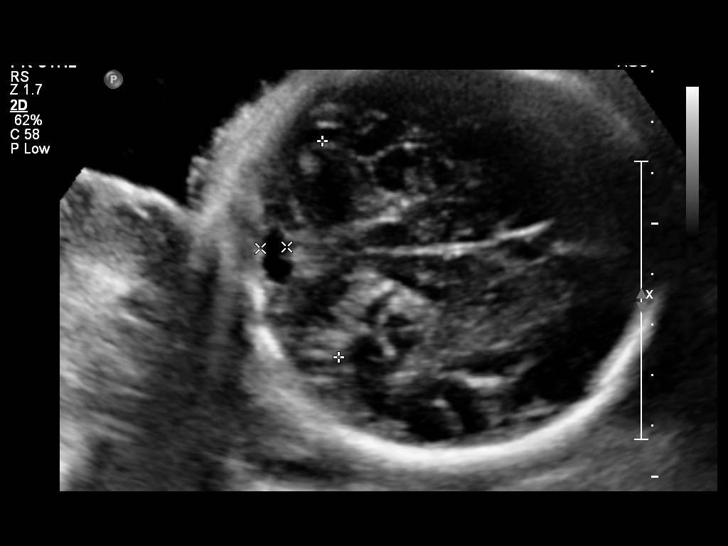
[im 89/100]
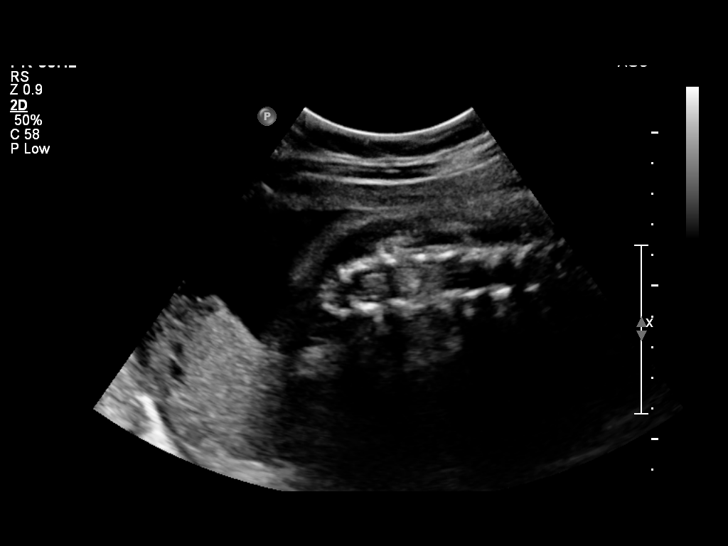
[im 96/100]
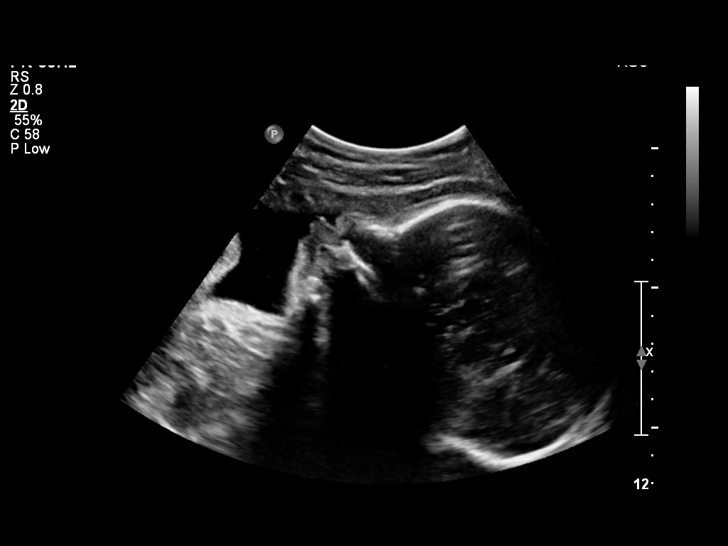

[12 of 28 positions shown; findings below may reference images not displayed]

OBSTETRICS REPORT
(Signed Final 01/28/2015 [DATE])

Service(s) Provided

US OB COMP + 14 WK                                    76805.1
Indications

Basic anatomic survey                                 z36
No or Little Prenatal Care
32 weeks gestation of pregnancy
Fetal Evaluation

Num Of Fetuses:    1
Fetal Heart Rate:  152                          bpm
Cardiac Activity:  Observed
Presentation:      Cephalic
Placenta:          Posterior, above cervical
os
P. Cord            Visualized, central
Insertion:

Amniotic Fluid
AFI FV:      Subjectively within normal limits
AFI Sum:     16.43   cm       59  %Tile     Larg Pckt:    5.45  cm
RUQ:   3.28    cm   RLQ:    3.7    cm    LUQ:   5.45    cm   LLQ:    4      cm
Biometry

BPD:       80  mm     G. Age:  32w 1d                CI:        74.94   70 - 86
FL/HC:      20.3   19.1 -
21.3
HC:     293.2  mm     G. Age:  32w 2d       17  %    HC/AC:      1.05   0.96 -
1.17
AC:     278.4  mm     G. Age:  32w 0d       38  %    FL/BPD:     74.3   71 - 87
FL:      59.4  mm     G. Age:  31w 0d       10  %    FL/AC:      21.3   20 - 24
HUM:       54  mm     G. Age:  31w 3d       35  %
CER:     42.7  mm     G. Age:  36w 5d     > 95  %

Est. FW:    2424  gm           4 lb     45  %
Gestational Age

LMP:           36w 1d        Date:  05/20/14                 EDD:   02/24/15
U/S Today:     31w 6d                                        EDD:   03/26/15
Best:          32w 2d     Det. By:  Early Ultrasound         EDD:   03/23/15
(08/13/14)
Anatomy

Cranium:          Appears normal         Aortic Arch:      Appears normal
Fetal Cavum:      Appears normal         Ductal Arch:      Appears normal
Ventricles:       Appears normal         Diaphragm:        Appears normal
Choroid Plexus:   Appears normal         Stomach:          Appears normal, left
sided
Cerebellum:       Appears normal         Abdomen:          Appears normal
Posterior Fossa:  Appears normal         Abdominal Wall:   Appears nml (cord
insert, abd wall)
Nuchal Fold:      Not applicable (>20    Cord Vessels:     Appears normal (3
wks GA)                                  vessel cord)
Face:             Appears normal         Kidneys:          Appear normal
(orbits and profile)
Lips:             Appears normal         Bladder:          Appears normal
Heart:            Appears normal         Spine:            Limited views
(4CH, axis, and                          appear normal
situs)
RVOT:             Appears normal         Lower             Visualized
Extremities:
LVOT:             Appears normal         Upper             Visualized
Extremities:

Other:  Male gender. Heels visualized. Nasal bone visualized. Technically
difficult due to advanced GA and fetal position.
Targeted Anatomy

Fetal Central Nervous System
Lat. Ventricles:  3.0                    Cisterna Magna:
Cervix Uterus Adnexa

Cervical Length:    3.96     cm

Cervix:       Normal appearance by transabdominal scan.
Uterus:       No abnormality visualized.

Left Ovary:    Not visualized.
Right Ovary:   Not visualized.
Adnexa:     No abnormality visualized. No adnexal mass visualized.
Impression

SIUP at 32+2 weeks
Normal detailed fetal anatomy
Normal amniotic fluid volume
Measurements consistent with prior US; EFW at the 45th
%tile
Recommendations

Follow-up as clinically indicated

## 2017-06-29 ENCOUNTER — Emergency Department (HOSPITAL_BASED_OUTPATIENT_CLINIC_OR_DEPARTMENT_OTHER)
Admission: EM | Admit: 2017-06-29 | Discharge: 2017-06-29 | Disposition: A | Discharge status: Home / Self Care | Payer: Self-pay | Attending: Emergency Medicine | Admitting: Emergency Medicine

## 2017-06-29 ENCOUNTER — Encounter (HOSPITAL_BASED_OUTPATIENT_CLINIC_OR_DEPARTMENT_OTHER): Payer: Self-pay | Admitting: Emergency Medicine

## 2017-06-29 DIAGNOSIS — N920 Excessive and frequent menstruation with regular cycle: Secondary | ICD-10-CM | POA: Insufficient documentation

## 2017-06-29 LAB — CBC WITH DIFFERENTIAL/PLATELET
Basophils Absolute: 0 10*3/uL (ref 0.0–0.1)
Basophils Relative: 0 %
Eosinophils Absolute: 0.2 10*3/uL (ref 0.0–0.7)
Eosinophils Relative: 3 %
HEMATOCRIT: 37.8 % (ref 36.0–46.0)
HEMOGLOBIN: 12.3 g/dL (ref 12.0–15.0)
LYMPHS ABS: 1.8 10*3/uL (ref 0.7–4.0)
Lymphocytes Relative: 34 %
MCH: 29.2 pg (ref 26.0–34.0)
MCHC: 32.5 g/dL (ref 30.0–36.0)
MCV: 89.8 fL (ref 78.0–100.0)
MONOS PCT: 15 %
Monocytes Absolute: 0.8 10*3/uL (ref 0.1–1.0)
NEUTROS ABS: 2.6 10*3/uL (ref 1.7–7.7)
NEUTROS PCT: 48 %
Platelets: 316 10*3/uL (ref 150–400)
RBC: 4.21 MIL/uL (ref 3.87–5.11)
RDW: 13.1 % (ref 11.5–15.5)
WBC: 5.4 10*3/uL (ref 4.0–10.5)

## 2017-06-29 LAB — PREGNANCY, URINE: Preg Test, Ur: NEGATIVE

## 2017-06-29 LAB — WET PREP, GENITAL
Clue Cells Wet Prep HPF POC: NONE SEEN
Sperm: NONE SEEN
Trich, Wet Prep: NONE SEEN
Yeast Wet Prep HPF POC: NONE SEEN

## 2017-06-29 MED ORDER — ONDANSETRON HCL 8 MG PO TABS
8.0000 mg | ORAL_TABLET | Freq: Once | ORAL | Status: AC
  Administered 2017-06-29: 8 mg via ORAL

## 2017-06-29 MED ORDER — IBUPROFEN 800 MG PO TABS
800.0000 mg | ORAL_TABLET | Freq: Once | ORAL | Status: AC
  Administered 2017-06-29: 800 mg via ORAL
  Filled 2017-06-29: qty 1

## 2017-06-29 MED ORDER — ONDANSETRON 8 MG PO TBDP: 8.0000 mg | ORAL_TABLET | Freq: Once | ORAL | Status: DC

## 2017-06-29 MED ORDER — IBUPROFEN 800 MG PO TABS: 800.0000 mg | ORAL_TABLET | Freq: Three times a day (TID) | ORAL | 0 refills | Status: DC | PRN

## 2017-06-29 MED ORDER — ONDANSETRON HCL 8 MG PO TABS
ORAL_TABLET | ORAL | Status: AC
  Administered 2017-06-29: 8 mg via ORAL
  Filled 2017-06-29: qty 1

## 2017-06-29 NOTE — ED Triage Notes (Signed)
Pt c/o period that is heavier than normal with abd cramping. Pt started period yesterday.

## 2017-06-29 NOTE — ED Notes (Signed)
Alert, NAD, calm, interactive, resps e/u, speaking in clear complete sentences, no dyspnea noted, skin W&D, VSS, c/o heavy painful period with cramps, clots abd and back pain, onset yesterday, usually painful and heavy, but this is more pain and bleeding than usual, has taken goodies today, took aleve yesterday, (denies: d/c, itching, odor, dysuria, NVD, fever, sob, dizziness or visual changes).

## 2017-06-29 NOTE — ED Notes (Signed)
EDP at [facility], pelvic in progress, chaperone present.

## 2017-06-29 NOTE — ED Provider Notes (Signed)
[facility]   CSN: [id number] Arrival date & time: 06/29/17  0020     History   Chief Complaint Chief Complaint  Patient presents with  . Vaginal Bleeding    HPI Tracy Hanna is a 26 y.o. female.  The history is provided by the patient. No language interpreter was used.  Vaginal Bleeding  Primary symptoms include vaginal bleeding.    Tracy Hanna is a 26 y.o. female who presents to the Emergency Department complaining of vaginal bleeding.  She has a history of heavy menstrual cycles. Her menstrual cycle started on Thursday night. Last one was one month ago. This month's cycle is significantly heavier than usual. This morning she awoke with blood all over the bed. She has been passing large clots throughout the day. She has been using the entire box of tampons since her cycle started 2 days ago. She denies any chest pain, shortness of breath. [id number].  Past Medical History:  Diagnosis Date  . Anemia   . Anxiety AGE 1   NO MEDS CURRENTLY  . Infection    UTI WITH PREGNANCY  . Infection 2008   TRICH  . Infection    OCC yeast  . Ovarian cyst    with current pregnancy   . Premature dilatation of cervix during pregnancy 04/09/2012   3 cm    Patient Active Problem List   Diagnosis Date Noted  . Encounter for supervision of other normal pregnancy in third trimester 01/26/2015  . Late prenatal care affecting pregnancy 01/26/2015  . Anxiety 03/18/2012    Past Surgical History:  Procedure Laterality Date  . NO PAST SURGERIES      OB History    Gravida Para Term Preterm AB Living   0 3   SAB TAB Ectopic Multiple Live Births   0 0 0 0 3       Home Medications    Prior to Admission medications   Medication Sig Start Date End Date Taking? Authorizing Provider  ibuprofen (ADVIL,MOTRIN) 800 MG tablet Take 1 tablet (800 mg total) by mouth every 8 (eight) hours as needed. 06/29/17   Tilden Fossa, MD    Family  History Family History  Problem Relation Age of Onset  . Hypertension Mother   . Asthma Father   . Hypertension Father   . Asthma Brother   . Asthma Sister   . Early death Sister 5       ASTHMA  . Asthma Maternal Aunt   . Arthritis Maternal Grandmother   . Diabetes Maternal Grandmother   . Hypertension Maternal Grandmother   . Arthritis Maternal Grandfather   . Other Neg Hx     Social History Social History  Substance Use Topics  . Smoking status: Never Smoker  . Smokeless tobacco: Never Used  . Alcohol use No     Comment: OCC     Allergies   Patient has no known allergies.   Review of Systems Review of Systems  Genitourinary: Positive for vaginal bleeding.  All other systems reviewed and are negative.    Physical Exam Updated Vital Signs BP (!) 146/97 (BP Location: Left Arm)   Pulse 91   Temp 98.1 F (36.7 C) (Oral)   Resp 18   Ht  (1.626 m)   Wt 67.6 kg (149 lb)   SpO2 99%   BMI 25.58 kg/m   Physical Exam  Constitutional: She is oriented to person, place, and time.  She appears well-developed and well-nourished.  HENT:  Head: Normocephalic and atraumatic.  Cardiovascular: Normal rate and regular rhythm.   No murmur heard. Pulmonary/Chest: Effort normal and breath sounds normal. No respiratory distress.  Abdominal: Soft. There is no tenderness. There is no rebound and no guarding.  Genitourinary:  Genitourinary Comments: Small to moderate amount of vaginal bleeding. No CMT, adnexal tenderness  Musculoskeletal: She exhibits no edema or tenderness.  Neurological: She is alert and oriented to person, place, and time.  Skin: Skin is warm and dry.  Psychiatric: She has a normal mood and affect. Her behavior is normal.  Nursing note and vitals reviewed.    ED Treatments / Results  Labs (all labs ordered are listed, but only abnormal results are displayed) Labs Reviewed  WET PREP, GENITAL - Abnormal; Notable for the following:       Result  Value   WBC, Wet Prep HPF POC FEW (*)    All other components within normal limits  PREGNANCY, URINE  CBC WITH DIFFERENTIAL/PLATELET  RPR  HIV ANTIBODY (ROUTINE TESTING)  GC/CHLAMYDIA PROBE AMP ([place]) NOT AT [facility]    EKG  EKG Interpretation None       Radiology No results found.  Procedures Procedures (including critical care time)  Medications Ordered in ED Medications  ibuprofen (ADVIL,MOTRIN) tablet 800 mg (800 mg Oral Given 06/29/17 0217)  ondansetron (ZOFRAN) tablet 8 mg (8 mg Oral Given 06/29/17 0220)     Initial Impression / Assessment and Plan / ED Course  I have reviewed the triage vital signs and the nursing notes.  Pertinent labs & imaging results that were available during my care of the patient were reviewed by me and considered in my medical decision making (see chart for details).     Patient here for evaluation of heavy vaginal bleeding. She has a small to moderate amount of bleeding on examination with no evidence of massive hemorrhage. Hemoglobin is stable as well as her vital signs. Discussed with patient home care for menorrhagia. Discussed ibuprofen as needed for cramping. Discussed OB/GYN follow-up and return precautions.  Final Clinical Impressions(s) / ED Diagnoses   Final diagnoses:  Menorrhagia with regular cycle    New Prescriptions Discharge Medication List as of 06/29/2017  2:32 AM    START taking these medications   Details  ibuprofen (ADVIL,MOTRIN) 800 MG tablet Take 1 tablet (800 mg total) by mouth every 8 (eight) hours as needed., Starting Sat 06/29/2017, Print         Tilden Fossa, MD 06/29/17 [phone]

## 2017-06-30 LAB — RPR: RPR Ser Ql: NONREACTIVE

## 2017-06-30 LAB — HIV ANTIBODY (ROUTINE TESTING W REFLEX): HIV Screen 4th Generation wRfx: NONREACTIVE

## 2017-07-01 LAB — GC/CHLAMYDIA PROBE AMP (~~LOC~~) NOT AT ARMC
CHLAMYDIA, DNA PROBE: NEGATIVE
NEISSERIA GONORRHEA: NEGATIVE

## 2017-10-15 ENCOUNTER — Encounter: Payer: Self-pay | Admitting: *Deleted

## 2018-02-12 ENCOUNTER — Encounter (HOSPITAL_BASED_OUTPATIENT_CLINIC_OR_DEPARTMENT_OTHER): Payer: Self-pay

## 2018-02-12 ENCOUNTER — Other Ambulatory Visit: Payer: Self-pay

## 2018-02-12 ENCOUNTER — Emergency Department (HOSPITAL_BASED_OUTPATIENT_CLINIC_OR_DEPARTMENT_OTHER)
Admission: EM | Admit: 2018-02-12 | Discharge: 2018-02-13 | Disposition: A | Discharge status: Home / Self Care | Payer: Self-pay | Attending: Emergency Medicine | Admitting: Emergency Medicine

## 2018-02-12 DIAGNOSIS — R509 Fever, unspecified: Secondary | ICD-10-CM | POA: Insufficient documentation

## 2018-02-12 DIAGNOSIS — K029 Dental caries, unspecified: Secondary | ICD-10-CM | POA: Insufficient documentation

## 2018-02-12 NOTE — ED Triage Notes (Signed)
Pt c/o dental pain since yesterday on left lower jaw, had a dose of OTC meds at 1900,  ibuprofen and  tylenol

## 2018-02-13 MED ORDER — HYDROCODONE-ACETAMINOPHEN 5-325 MG PO TABS: 1.0000 | ORAL_TABLET | ORAL | 0 refills | Status: DC | PRN

## 2018-02-13 MED ORDER — PENICILLIN V POTASSIUM 500 MG PO TABS: 500.0000 mg | ORAL_TABLET | Freq: Four times a day (QID) | ORAL | 0 refills | Status: DC

## 2018-02-13 MED ORDER — BUPIVACAINE-EPINEPHRINE (PF) 0.5% -1:200000 IJ SOLN
INTRAMUSCULAR | Status: AC
  Filled 2018-02-13: qty 1.8

## 2018-02-13 MED ORDER — BUPIVACAINE-EPINEPHRINE (PF) 0.5% -1:200000 IJ SOLN
1.8000 mL | Freq: Once | INTRAMUSCULAR | Status: AC
  Administered 2018-02-13: 1.8 mL

## 2018-02-13 MED ORDER — PENICILLIN V POTASSIUM 250 MG PO TABS
500.0000 mg | ORAL_TABLET | Freq: Once | ORAL | Status: AC
  Administered 2018-02-13: 500 mg via ORAL
  Filled 2018-02-13: qty 2

## 2018-02-13 NOTE — ED Provider Notes (Signed)
[facility] Provider Note: Tracy Dell, MD, FACEP  CSN: [id number] MRN: [id number] ARRIVAL: 02/12/18 at 2302 ROOM: MH06/MH06   CHIEF COMPLAINT  Dental Pain   HISTORY OF PRESENT ILLNESS  02/13/18 12:00 AM Tracy Hanna is a 27 y.o. female with a severely carious left lower second molar.  She has been having pain in that molar for the past 2 days.  She has taken ibuprofen and acetaminophen without relief.  She describes the pain is severe and worse with eating or drinking.  She is also having pain left anterior cervical lymphadenopathy.  She has had a subjective fever but does not know how high it was.   Past Medical History:  Diagnosis Date  . Anemia   . Anxiety AGE 39   NO MEDS CURRENTLY  . Infection    UTI WITH PREGNANCY  . Infection 2008   TRICH  . Infection    OCC yeast  . Ovarian cyst    with current pregnancy   . Premature dilatation of cervix during pregnancy 04/09/2012   3 cm    Past Surgical History:  Procedure Laterality Date  . NO PAST SURGERIES      Family History  Problem Relation Age of Onset  . Hypertension Mother   . Asthma Father   . Hypertension Father   . Asthma Brother   . Asthma Sister   . Early death Sister 5       ASTHMA  . Asthma Maternal Aunt   . Arthritis Maternal Grandmother   . Diabetes Maternal Grandmother   . Hypertension Maternal Grandmother   . Arthritis Maternal Grandfather   . Other Neg Hx     Social History   Tobacco Use  . Smoking status: Never Smoker  . Smokeless tobacco: Never Used  Substance Use Topics  . Alcohol use: No    Comment: OCC  . Drug use: No    Prior to Admission medications   Medication Sig Start Date End Date Taking? Authorizing Provider  ibuprofen (ADVIL,MOTRIN) 800 MG tablet Take 1 tablet (800 mg total) by mouth every 8 (eight) hours as needed. 06/29/17   Tilden Fossa, MD    Allergies Patient has no known allergies.   REVIEW OF SYSTEMS  Negative except as noted here  or in the History of Present Illness.   PHYSICAL EXAMINATION  Initial Vital Signs Blood pressure (!) 135/109, pulse 71, temperature 98.9 F (37.2 C), temperature source Oral, resp. rate 18, height  (1.626 m), weight 67.6 kg (149 lb), SpO2 100 %, unknown if currently breastfeeding.  Examination General: Well-developed, well-nourished female in no acute distress; appearance consistent with age of record HENT: normocephalic; atraumatic; carious left lower second molar with tenderness to percussion, adjacent impacted third molar Eyes: pupils equal, round and reactive to light; extraocular muscles intact Neck: supple; left anterior cervical lymphadenopathy Heart: regular rate and rhythm Lungs: clear to auscultation bilaterally Abdomen: soft; nondistended; nontender; bowel sounds present Extremities: No deformity; full range of motion Neurologic: Awake, alert and oriented; motor function intact in all extremities and symmetric; no facial droop Skin: Warm and dry Psychiatric: Normal mood and affect   RESULTS  Summary of this visit's results, reviewed by myself:   EKG Interpretation  Date/Time:    Ventricular Rate:    PR Interval:    QRS Duration:   QT Interval:    QTC Calculation:   R Axis:     Text Interpretation:        Laboratory  Studies: No results found for this or any previous visit (from the past 24 hour(s)). Imaging Studies: No results found.  ED COURSE and MDM  Nursing notes and initial vitals signs, including pulse oximetry, reviewed.  Vitals:   02/12/18 2310 02/12/18 2311  BP: (!) 135/109   Pulse: 71   Resp: 18   Temp: 98.9 F (37.2 C)   TempSrc: Oral   SpO2: 100%   Weight:  67.6 kg (149 lb)  Height:   (1.626 m)   Will refer to Dr. Barbette Merino, oral surgeon on-call.  We will start her on penicillin as she is at high risk for dental infection.  PROCEDURES   DENTAL BLOCK 1.8 milliliters of 0.5% bupivacaine with epinephrine were injected into the  buccal fold adjacent to the left lower second molar. The patient tolerated this well and there were no immediate complications. Adequate analgesia was obtained.  Consultation with the [facility] state controlled substances database reveals the patient has received no opioid prescriptions in the past 2 years.  ED DIAGNOSES     ICD-10-CM   1. Pain due to dental caries K02.9        Conda Wannamaker, MD 02/13/18 1610

## 2018-02-13 NOTE — ED Notes (Signed)
ED Provider at bedside. 

## 2020-05-29 ENCOUNTER — Encounter (HOSPITAL_BASED_OUTPATIENT_CLINIC_OR_DEPARTMENT_OTHER): Payer: Self-pay | Admitting: *Deleted

## 2020-05-29 ENCOUNTER — Other Ambulatory Visit: Payer: Self-pay

## 2020-05-29 ENCOUNTER — Emergency Department (HOSPITAL_BASED_OUTPATIENT_CLINIC_OR_DEPARTMENT_OTHER)
Admission: EM | Admit: 2020-05-29 | Discharge: 2020-05-29 | Disposition: A | Discharge status: Home / Self Care | Payer: Medicaid Other | Attending: Emergency Medicine | Admitting: Emergency Medicine

## 2020-05-29 DIAGNOSIS — M62830 Muscle spasm of back: Secondary | ICD-10-CM | POA: Insufficient documentation

## 2020-05-29 MED ORDER — NAPROXEN 250 MG PO TABS
500.0000 mg | ORAL_TABLET | Freq: Once | ORAL | Status: AC
  Administered 2020-05-29: 500 mg via ORAL
  Filled 2020-05-29: qty 2

## 2020-05-29 MED ORDER — CYCLOBENZAPRINE HCL 10 MG PO TABS
10.0000 mg | ORAL_TABLET | Freq: Once | ORAL | Status: AC
  Administered 2020-05-29: 10 mg via ORAL
  Filled 2020-05-29: qty 1

## 2020-05-29 MED ORDER — HYDROCODONE-ACETAMINOPHEN 5-325 MG PO TABS
1.0000 | ORAL_TABLET | Freq: Four times a day (QID) | ORAL | 0 refills | Status: AC | PRN
Start: 2020-05-29 — End: ?

## 2020-05-29 MED ORDER — CYCLOBENZAPRINE HCL 10 MG PO TABS: 10.0000 mg | ORAL_TABLET | Freq: Three times a day (TID) | ORAL | 0 refills | Status: AC | PRN

## 2020-05-29 NOTE — ED Provider Notes (Signed)
[facility] Provider Note: [name], MD, FACEP  CSN: [id number] MRN: [id number] ARRIVAL: 05/29/20 at 0439 ROOM: MH05/MH05   CHIEF COMPLAINT  Back Pain   HISTORY OF PRESENT ILLNESS  05/29/20 4:58 AM Tracy Hanna is a 29 y.o. female with back pain that began yesterday after lifting a heavy TV.  The pain is in her mid back to the left of her spine.  It is burning and spasm-like in nature.  She rates it as a 10 out of 10, worse with movement or palpation.  It radiates down to her right lower back but not into her right leg.     Past Medical History:  Diagnosis Date  . Anemia   . Anxiety AGE 47   NO MEDS CURRENTLY  . Infection    UTI WITH PREGNANCY  . Infection 2008   TRICH  . Infection    OCC yeast  . Ovarian cyst    with current pregnancy   . Premature dilatation of cervix during pregnancy 04/09/2012   3 cm    Past Surgical History:  Procedure Laterality Date  . NO PAST SURGERIES      Family History  Problem Relation Age of Onset  . Hypertension Mother   . Asthma Father   . Hypertension Father   . Asthma Brother   . Asthma Sister   . Early death Sister 5       ASTHMA  . Asthma Maternal Aunt   . Arthritis Maternal Grandmother   . Diabetes Maternal Grandmother   . Hypertension Maternal Grandmother   . Arthritis Maternal Grandfather   . Other Neg Hx     Social History   Tobacco Use  . Smoking status: Never Smoker  . Smokeless tobacco: Never Used  Substance Use Topics  . Alcohol use: No    Comment: OCC  . Drug use: No    Prior to Admission medications   Medication Sig Start Date End Date Taking? Authorizing Provider  cyclobenzaprine (FLEXERIL) 10 MG tablet Take 1 tablet (10 mg total) by mouth 3 (three) times daily as needed for muscle spasms. 05/29/20   Tailor Westfall, MD  HYDROcodone-acetaminophen (NORCO) 5-325 MG tablet Take 1 tablet by mouth every 6 (six) hours as needed for severe pain. 05/29/20   Chijioke Lasser, Jonny Ruiz, MD     Allergies Patient has no known allergies.   REVIEW OF SYSTEMS  Negative except as noted here or in the History of Present Illness.   PHYSICAL EXAMINATION  Initial Vital Signs Blood pressure (!) 142/72, pulse 89, temperature 98.2 F (36.8 C), temperature source Oral, height 5\' 4"  (1.626 m), weight 69.9 kg, last menstrual period 05/17/2020, SpO2 97 %, unknown if currently breastfeeding.  Examination General: Well-developed, well-nourished female in no acute distress; appearance consistent with age of record HENT: normocephalic; atraumatic Eyes: Normal appearance Neck: supple Heart: regular rate and rhythm Lungs: clear to auscultation bilaterally Abdomen: soft; nondistended; nontender; bowel sounds present Back: Right lateral mid back tenderness Extremities: No deformity; pulses normal Neurologic: Awake, alert and oriented; motor function intact in all extremities and symmetric; no facial droop Skin: Warm and dry Psychiatric: Grimacing   RESULTS  Summary of this visit's results, reviewed and interpreted by myself:   EKG Interpretation  Date/Time:    Ventricular Rate:    PR Interval:    QRS Duration:   QT Interval:    QTC Calculation:   R Axis:     Text Interpretation:  Laboratory Studies: No results found for this or any previous visit (from the past 24 hour(s)). Imaging Studies: No results found.  ED COURSE and MDM  Nursing notes, initial and subsequent vitals signs, including pulse oximetry, reviewed and interpreted by myself.  Vitals:   05/29/20 0446 05/29/20 0500  BP:  (!) 142/72  Pulse:  89  Temp:  98.2 F (36.8 C)  TempSrc:  Oral  SpO2:  97%  Weight: 69.9 kg   Height: 5\' 4"  (1.626 m)    Medications  naproxen (NAPROSYN) tablet 500 mg (has no administration in time range)  cyclobenzaprine (FLEXERIL) tablet 10 mg (has no administration in time range)    Patient's pain is consistent with acute muscle spasms after moving yesterday.  We  will treat with a muscle relaxant and a short course of hydrocodone.  PROCEDURES  Procedures   ED DIAGNOSES     ICD-10-CM   1. Muscle spasm of back  M62.830        Tamaria Dunleavy, , MD 05/29/20 [phone]

## 2020-05-29 NOTE — ED Triage Notes (Signed)
C/o right back pain that started yesterday after lifting a TV while moving. States pain radiates down her leg. C/o spasm. Denies any other injury

## 2020-05-29 NOTE — ED Notes (Signed)
Pt able ambulate enough to get out of bed and into car. States she is feeling some improvement in her sx.

## 2020-08-31 ENCOUNTER — Encounter (HOSPITAL_BASED_OUTPATIENT_CLINIC_OR_DEPARTMENT_OTHER): Payer: Self-pay

## 2020-08-31 ENCOUNTER — Emergency Department (HOSPITAL_BASED_OUTPATIENT_CLINIC_OR_DEPARTMENT_OTHER)
Admission: EM | Admit: 2020-08-31 | Discharge: 2020-08-31 | Disposition: A | Discharge status: Home / Self Care | Payer: Self-pay | Attending: Emergency Medicine | Admitting: Emergency Medicine

## 2020-08-31 ENCOUNTER — Other Ambulatory Visit: Payer: Self-pay

## 2020-08-31 DIAGNOSIS — B9689 Other specified bacterial agents as the cause of diseases classified elsewhere: Secondary | ICD-10-CM | POA: Insufficient documentation

## 2020-08-31 DIAGNOSIS — O210 Mild hyperemesis gravidarum: Secondary | ICD-10-CM

## 2020-08-31 DIAGNOSIS — B3731 Acute candidiasis of vulva and vagina: Secondary | ICD-10-CM

## 2020-08-31 DIAGNOSIS — N72 Inflammatory disease of cervix uteri: Secondary | ICD-10-CM | POA: Insufficient documentation

## 2020-08-31 DIAGNOSIS — B373 Candidiasis of vulva and vagina: Secondary | ICD-10-CM | POA: Insufficient documentation

## 2020-08-31 DIAGNOSIS — O26892 Other specified pregnancy related conditions, second trimester: Secondary | ICD-10-CM | POA: Insufficient documentation

## 2020-08-31 DIAGNOSIS — N760 Acute vaginitis: Secondary | ICD-10-CM | POA: Insufficient documentation

## 2020-08-31 DIAGNOSIS — O211 Hyperemesis gravidarum with metabolic disturbance: Secondary | ICD-10-CM | POA: Insufficient documentation

## 2020-08-31 DIAGNOSIS — Z3491 Encounter for supervision of normal pregnancy, unspecified, first trimester: Secondary | ICD-10-CM

## 2020-08-31 DIAGNOSIS — Z3A08 8 weeks gestation of pregnancy: Secondary | ICD-10-CM | POA: Insufficient documentation

## 2020-08-31 LAB — COMPREHENSIVE METABOLIC PANEL
ALT: 7 U/L (ref 0–44)
AST: 15 U/L (ref 15–41)
Albumin: 3.8 g/dL (ref 3.5–5.0)
Alkaline Phosphatase: 31 U/L — ABNORMAL LOW (ref 38–126)
Anion gap: 12 (ref 5–15)
BUN: 9 mg/dL (ref 6–20)
CO2: 23 mmol/L (ref 22–32)
Calcium: 9 mg/dL (ref 8.9–10.3)
Chloride: 100 mmol/L (ref 98–111)
Creatinine, Ser: 0.79 mg/dL (ref 0.44–1.00)
GFR, Estimated: >60 mL/min (ref >60)
Glucose, Bld: 81 mg/dL (ref 70–99)
Potassium: 3.6 mmol/L (ref 3.5–5.1)
Sodium: 135 mmol/L (ref 135–145)
Total Bilirubin: 0.5 mg/dL (ref 0.3–1.2)
Total Protein: 7.9 g/dL (ref 6.5–8.1)

## 2020-08-31 LAB — CBC WITH DIFFERENTIAL/PLATELET
Abs Immature Granulocytes: 0.04 10*3/uL (ref 0.00–0.07)
Basophils Absolute: 0 10*3/uL (ref 0.0–0.1)
Basophils Relative: 0 %
Eosinophils Absolute: 0.1 10*3/uL (ref 0.0–0.5)
Eosinophils Relative: 1 %
HCT: 37.8 % (ref 36.0–46.0)
Hemoglobin: 12.3 g/dL (ref 12.0–15.0)
Immature Granulocytes: 1 %
Lymphocytes Relative: 28 %
Lymphs Abs: 2.3 10*3/uL (ref 0.7–4.0)
MCH: 28 pg (ref 26.0–34.0)
MCHC: 32.5 g/dL (ref 30.0–36.0)
MCV: 85.9 fL (ref 80.0–100.0)
Monocytes Absolute: 1.2 10*3/uL — ABNORMAL HIGH (ref 0.1–1.0)
Monocytes Relative: 14 %
Neutro Abs: 4.5 10*3/uL (ref 1.7–7.7)
Neutrophils Relative %: 56 %
Platelets: 327 10*3/uL (ref 150–400)
RBC: 4.4 MIL/uL (ref 3.87–5.11)
RDW: 15.9 % — ABNORMAL HIGH (ref 11.5–15.5)
WBC: 8.2 10*3/uL (ref 4.0–10.5)
nRBC: 0 % (ref 0.0–0.2)

## 2020-08-31 LAB — URINALYSIS, ROUTINE W REFLEX MICROSCOPIC
Bilirubin Urine: NEGATIVE
Glucose, UA: NEGATIVE mg/dL
Hgb urine dipstick: NEGATIVE
Ketones, ur: NEGATIVE mg/dL
Nitrite: NEGATIVE
Protein, ur: NEGATIVE mg/dL
Specific Gravity, Urine: >1.03 — ABNORMAL HIGH (ref 1.005–1.030)
pH: 6 (ref 5.0–8.0)

## 2020-08-31 LAB — URINALYSIS, MICROSCOPIC (REFLEX)

## 2020-08-31 LAB — WET PREP, GENITAL
Sperm: NONE SEEN
Trich, Wet Prep: NONE SEEN

## 2020-08-31 LAB — LIPASE, BLOOD: Lipase: 28 U/L (ref 11–51)

## 2020-08-31 LAB — PREGNANCY, URINE: Preg Test, Ur: POSITIVE — AB

## 2020-08-31 MED ORDER — CLOTRIMAZOLE 1 % VA CREA: 1.0000 | TOPICAL_CREAM | Freq: Every day | VAGINAL | 0 refills | Status: AC

## 2020-08-31 MED ORDER — METRONIDAZOLE 500 MG PO TABS
2000.0000 mg | ORAL_TABLET | Freq: Once | ORAL | Status: AC
  Administered 2020-08-31: 21:00:00 2000 mg via ORAL
  Filled 2020-08-31: qty 4

## 2020-08-31 MED ORDER — METOCLOPRAMIDE HCL 5 MG/ML IJ SOLN
10.0000 mg | Freq: Once | INTRAMUSCULAR | Status: AC
  Administered 2020-08-31: 19:00:00 10 mg via INTRAVENOUS
  Filled 2020-08-31: qty 2

## 2020-08-31 MED ORDER — DIPHENHYDRAMINE HCL 50 MG/ML IJ SOLN
25.0000 mg | Freq: Once | INTRAMUSCULAR | Status: AC
  Administered 2020-08-31: 19:00:00 25 mg via INTRAVENOUS
  Filled 2020-08-31: qty 1

## 2020-08-31 MED ORDER — METOCLOPRAMIDE HCL 10 MG PO TABS: 10.0000 mg | ORAL_TABLET | Freq: Four times a day (QID) | ORAL | 0 refills | Status: AC

## 2020-08-31 MED ORDER — CEFTRIAXONE SODIUM 500 MG IJ SOLR
500.0000 mg | Freq: Once | INTRAMUSCULAR | Status: AC
  Administered 2020-08-31: 21:00:00 500 mg via INTRAMUSCULAR
  Filled 2020-08-31: qty 500

## 2020-08-31 MED ORDER — AZITHROMYCIN 250 MG PO TABS
1000.0000 mg | ORAL_TABLET | Freq: Once | ORAL | Status: AC
  Administered 2020-08-31: 21:00:00 1000 mg via ORAL
  Filled 2020-08-31: qty 4

## 2020-08-31 MED ORDER — DOXYLAMINE-PYRIDOXINE 10-10 MG PO TBEC: DELAYED_RELEASE_TABLET | ORAL | 0 refills | Status: AC

## 2020-08-31 MED ORDER — DOXYLAMINE-PYRIDOXINE 10-10 MG PO TBEC: DELAYED_RELEASE_TABLET | ORAL | 0 refills | Status: DC

## 2020-08-31 MED ORDER — SODIUM CHLORIDE 0.9 % IV BOLUS
1000.0000 mL | Freq: Once | INTRAVENOUS | Status: AC
  Administered 2020-08-31: 19:00:00 1000 mL via INTRAVENOUS

## 2020-08-31 NOTE — ED Provider Notes (Signed)
[facility] EMERGENCY DEPARTMENT Provider Note   CSN: [id number] Arrival date & time: 08/31/20  1802     History Chief Complaint  Patient presents with  . Vomiting    ?preg    Tracy Hanna is a 29 y.o. female.  29 yo F with a chief complaints of nausea and vomiting.  Patient feels like she has been able to keep anything down for a couple weeks now.  Having some lower abdominal cramping as well.  Couple weeks ago she took a pregnancy test that was positive at home.  Last menstrual cycle was the third week of October.  She denies any fevers.  Denies diarrhea.  Has had some vaginal discharge.  Denies vaginal bleeding.  Has had increased urinary frequency.  Denies dysuria or hesitancy.  Denies flank pain.  Denies cough or congestion.  The history is provided by the patient.  Illness Severity:  Moderate Onset quality:  Gradual Duration:  2 weeks Timing:  Constant Progression:  Worsening Chronicity:  New Associated symptoms: abdominal pain, nausea and vomiting   Associated symptoms: no chest pain, no congestion, no fever, no headaches, no myalgias, no rhinorrhea, no shortness of breath and no wheezing        Past Medical History:  Diagnosis Date  . Anemia   . Anxiety AGE 68   NO MEDS CURRENTLY  . Infection    UTI WITH PREGNANCY  . Infection 2008   TRICH  . Infection    OCC yeast  . Ovarian cyst    with current pregnancy   . Premature dilatation of cervix during pregnancy 04/09/2012   3 cm    Patient Active Problem List   Diagnosis Date Noted  . Encounter for supervision of other normal pregnancy in third trimester 01/26/2015  . Late prenatal care affecting pregnancy 01/26/2015  . Anxiety 03/18/2012    Past Surgical History:  Procedure Laterality Date  . NO PAST SURGERIES       OB History    Gravida  3   Para  3   Term  2   Preterm  1   AB  0   Living  3     SAB  0   IAB  0   Ectopic  0   Multiple  0   Live Births  3            Family History  Problem Relation Age of Onset  . Hypertension Mother   . Asthma Father   . Hypertension Father   . Asthma Brother   . Asthma Sister   . Early death Sister 5       ASTHMA  . Asthma Maternal Aunt   . Arthritis Maternal Grandmother   . Diabetes Maternal Grandmother   . Hypertension Maternal Grandmother   . Arthritis Maternal Grandfather   . Other Neg Hx     Social History   Tobacco Use  . Smoking status: Never Smoker  . Smokeless tobacco: Never Used  Substance Use Topics  . Alcohol use: Yes    Comment: occ  . Drug use: No    Home Medications Prior to Admission medications   Medication Sig Start Date End Date Taking? Authorizing Provider  clotrimazole (GYNE-LOTRIMIN) 1 % vaginal cream Place 1 Applicatorful vaginally at bedtime. 08/31/20   Melene Plan, DO  cyclobenzaprine (FLEXERIL) 10 MG tablet Take 1 tablet (10 mg total) by mouth 3 (three) times daily as needed for muscle spasms. 05/29/20   Molpus,  John, MD  Doxylamine-Pyridoxine 10-10 MG TBEC Take this at night before bed.  If you continue to have nausea and vomiting take twice a day. 08/31/20   Melene Plan, DO  HYDROcodone-acetaminophen (NORCO) 5-325 MG tablet Take 1 tablet by mouth every 6 (six) hours as needed for severe pain. 05/29/20   Molpus, John, MD  metoCLOPramide (REGLAN) 10 MG tablet Take 1 tablet (10 mg total) by mouth every 6 (six) hours. 08/31/20   Melene Plan, DO    Allergies    Patient has no known allergies.  Review of Systems   Review of Systems  Constitutional: Negative for chills and fever.  HENT: Negative for congestion and rhinorrhea.   Eyes: Negative for redness and visual disturbance.  Respiratory: Negative for shortness of breath and wheezing.   Cardiovascular: Negative for chest pain and palpitations.  Gastrointestinal: Positive for abdominal pain, nausea and vomiting.  Genitourinary: Negative for dysuria and urgency.  Musculoskeletal: Negative for arthralgias and myalgias.   Skin: Negative for pallor and wound.  Neurological: Negative for dizziness and headaches.    Physical Exam Updated Vital Signs BP 124/82 (BP Location: Left Arm)   Pulse 87   Temp 98.6 F (37 C) (Oral)   Resp 18   Ht 5\' 4"  (1.626 m)   Wt 77.1 kg   LMP 07/09/2020   SpO2 100%   BMI 29.18 kg/m   Physical Exam Vitals and nursing note reviewed.  Constitutional:      General: She is not in acute distress.    Appearance: She is well-developed and well-nourished. She is not diaphoretic.  HENT:     Head: Normocephalic and atraumatic.  Eyes:     Extraocular Movements: EOM normal.     Pupils: Pupils are equal, round, and reactive to light.  Cardiovascular:     Rate and Rhythm: Normal rate and regular rhythm.     Heart sounds: No murmur heard. No friction rub. No gallop.   Pulmonary:     Effort: Pulmonary effort is normal.     Breath sounds: No wheezing or rales.  Abdominal:     General: There is no distension.     Palpations: Abdomen is soft.     Tenderness: There is abdominal tenderness.     Comments: Tenderness diffusely to the lower abdomen.  Musculoskeletal:        General: No tenderness or edema.     Cervical back: Normal range of motion and neck supple.  Skin:    General: Skin is warm and dry.  Neurological:     Mental Status: She is alert and oriented to person, place, and time.  Psychiatric:        Mood and Affect: Mood and affect normal.        Behavior: Behavior normal.     ED Results / Procedures / Treatments   Labs (all labs ordered are listed, but only abnormal results are displayed) Labs Reviewed  WET PREP, GENITAL - Abnormal; Notable for the following components:      Result Value   Yeast Wet Prep HPF POC PRESENT (*)    Clue Cells Wet Prep HPF POC PRESENT (*)    WBC, Wet Prep HPF POC MANY (*)    All other components within normal limits  PREGNANCY, URINE - Abnormal; Notable for the following components:   Preg Test, Ur POSITIVE (*)    All other  components within normal limits  URINALYSIS, ROUTINE W REFLEX MICROSCOPIC - Abnormal; Notable for the following components:  Specific Gravity, Urine >1.030 (*)    Leukocytes,Ua TRACE (*)    All other components within normal limits  CBC WITH DIFFERENTIAL/PLATELET - Abnormal; Notable for the following components:   RDW 15.9 (*)    Monocytes Absolute 1.2 (*)    All other components within normal limits  COMPREHENSIVE METABOLIC PANEL - Abnormal; Notable for the following components:   Alkaline Phosphatase 31 (*)    All other components within normal limits  URINALYSIS, MICROSCOPIC (REFLEX) - Abnormal; Notable for the following components:   Bacteria, UA FEW (*)    All other components within normal limits  LIPASE, BLOOD  GC/CHLAMYDIA PROBE AMP ([place]) NOT AT [facility]    EKG None  Radiology No results found.  Procedures Procedures (including critical care time) EMERGENCY DEPARTMENT [place] PREGNANCY "Study: Limited Ultrasound of the Pelvis"  INDICATIONS:Pregnancy(required) and Abdominal or pelvic pain Multiple views of the uterus and pelvic cavity are obtained with a multi-frequency probe.  APPROACH:Transabdominal   PERFORMED BY: Myself  IMAGES ARCHIVED?: Yes  LIMITATIONS: Body habitus, Decompressed bladder and pain  PREGNANCY FREE FLUID: None  PREGNANCY UTERUS FINDINGS:Uterus enlarged and Gestational sac noted ADNEXAL FINDINGS:Left ovary not seen and Right ovary not seen  PREGNANCY FINDINGS: Intrauterine gestational sac noted, Yolk sac noted, Fetal pole present, Fetal heart activity seen and Multiple pregancy noted  INTERPRETATION: Intrauterine gestational sac noted, Yolk sac noted and Multiple pregnancy noted, two discrete sacs, one without obvious contents, more posterior to the edge of the uterus  GESTATIONAL AGE, ESTIMATE: 7wk 6 days  Medications Ordered in ED Medications  sodium chloride 0.9 % bolus 1,000 mL (1,000 mLs Intravenous New Bag/Given 08/31/20 1846)   metoCLOPramide (REGLAN) injection 10 mg (10 mg Intravenous Given 08/31/20 1848)  diphenhydrAMINE (BENADRYL) injection 25 mg (25 mg Intravenous Given 08/31/20 1850)  cefTRIAXone (ROCEPHIN) injection 500 mg (500 mg Intramuscular Given 08/31/20 2034)  metroNIDAZOLE (FLAGYL) tablet 2,000 mg (2,000 mg Oral Given 08/31/20 2030)  azithromycin (ZITHROMAX) tablet 1,000 mg (1,000 mg Oral Given 08/31/20 2030)    ED Course  I have reviewed the triage vital signs and the nursing notes.  Pertinent labs & imaging results that were available during my care of the patient were reviewed by me and considered in my medical decision making (see chart for details).    MDM Rules/Calculators/A&P                          29 yo F with a chief complaints of nausea and vomiting.  Going on for the past couple weeks.  Patient states that she is not been able to keep anything down.  She does appear well-hydrated on my exam.  Will give a bolus of IV fluids nausea medicine lab work UA.  Pelvic exam.  Reassess.  Pelvic exam is concerning for PID.  Bedside ultrasound with IUP approximately 8 weeks.  No adnexal findings.  There was a extra sac that was adjacent to the uterine tissue that seemed more posterior.  I discussed this with OB/GYN, Dr. Elroy Channel.  Do not feel the patient would benefit from formal ultrasound at this time.  Recommended follow-up with OB/GYN in the office.  9:05 PM:  I have discussed the diagnosis/risks/treatment options with the patient and family and believe the pt to be eligible for discharge home to follow-up with OB. We also discussed returning to the ED immediately if new or worsening sx occur. We discussed the sx which are most concerning (e.g., sudden worsening  pain, fever, inability to tolerate by mouth) that necessitate immediate return. Medications administered to the patient during their visit and any new prescriptions provided to the patient are listed below.  Medications given during this  visit Medications  sodium chloride 0.9 % bolus 1,000 mL (1,000 mLs Intravenous New Bag/Given 08/31/20 1846)  metoCLOPramide (REGLAN) injection 10 mg (10 mg Intravenous Given 08/31/20 1848)  diphenhydrAMINE (BENADRYL) injection 25 mg (25 mg Intravenous Given 08/31/20 1850)  cefTRIAXone (ROCEPHIN) injection 500 mg (500 mg Intramuscular Given 08/31/20 2034)  metroNIDAZOLE (FLAGYL) tablet 2,000 mg (2,000 mg Oral Given 08/31/20 2030)  azithromycin (ZITHROMAX) tablet 1,000 mg (1,000 mg Oral Given 08/31/20 2030)     The patient appears reasonably screen and/or stabilized for discharge and I doubt any other medical condition or other [facility] requiring further screening, evaluation, or treatment in the ED at this time prior to discharge.     Final Clinical Impression(s) / ED Diagnoses Final diagnoses:  First trimester pregnancy  Hyperemesis gravidarum  BV (bacterial vaginosis)  Yeast infection of the vagina  Cervicitis    Rx / DC Orders ED Discharge Orders         Ordered    clotrimazole (GYNE-LOTRIMIN) 1 % vaginal cream  Daily at bedtime        08/31/20 2011    Doxylamine-Pyridoxine 10-10 MG TBEC  Status:  Discontinued        08/31/20 2011    Doxylamine-Pyridoxine 10-10 MG TBEC        08/31/20 2040    metoCLOPramide (REGLAN) 10 MG tablet  Every 6 hours        08/31/20 2040           Melene PlanFloyd, Kalem Rockwell, DO 08/31/20 2105

## 2020-08-31 NOTE — Discharge Instructions (Addendum)
Go to a drugstore and buy unisom and vitaminb6(pyridoxine) Take 1/2 a tab of unisom(12.5mg) and 25mg of vitamin b6.  Take this at night before bed.  If you continue to have nausea and vomiting take twice a day.  [facility]. °

## 2020-08-31 NOTE — ED Notes (Signed)
Called GYN @ [phone] for consult with Dr Adela Lank

## 2020-08-31 NOTE — ED Triage Notes (Signed)
Pt c/o n/v-states she had a pos home preg test 2 weeks ago-NAD-steady gait

## 2020-09-01 LAB — GC/CHLAMYDIA PROBE AMP (~~LOC~~) NOT AT ARMC
Chlamydia: NEGATIVE
Comment: NEGATIVE
Comment: NORMAL
Neisseria Gonorrhea: NEGATIVE

## 2020-09-23 ENCOUNTER — Inpatient Hospital Stay (HOSPITAL_COMMUNITY): Payer: Self-pay

## 2020-09-23 ENCOUNTER — Encounter (HOSPITAL_COMMUNITY): Payer: Self-pay | Admitting: Obstetrics and Gynecology

## 2020-09-23 ENCOUNTER — Other Ambulatory Visit: Payer: Self-pay

## 2020-09-23 ENCOUNTER — Inpatient Hospital Stay (HOSPITAL_COMMUNITY)
Admission: AD | Admit: 2020-09-23 | Discharge: 2020-09-23 | Disposition: A | Discharge status: Home / Self Care | Payer: Self-pay | Attending: Obstetrics and Gynecology | Admitting: Obstetrics and Gynecology

## 2020-09-23 DIAGNOSIS — R109 Unspecified abdominal pain: Secondary | ICD-10-CM | POA: Insufficient documentation

## 2020-09-23 DIAGNOSIS — O26891 Other specified pregnancy related conditions, first trimester: Secondary | ICD-10-CM

## 2020-09-23 DIAGNOSIS — O210 Mild hyperemesis gravidarum: Secondary | ICD-10-CM | POA: Insufficient documentation

## 2020-09-23 DIAGNOSIS — Z3491 Encounter for supervision of normal pregnancy, unspecified, first trimester: Secondary | ICD-10-CM

## 2020-09-23 DIAGNOSIS — Z87440 Personal history of urinary (tract) infections: Secondary | ICD-10-CM | POA: Insufficient documentation

## 2020-09-23 DIAGNOSIS — Z3A10 10 weeks gestation of pregnancy: Secondary | ICD-10-CM

## 2020-09-23 DIAGNOSIS — O219 Vomiting of pregnancy, unspecified: Secondary | ICD-10-CM

## 2020-09-23 DIAGNOSIS — O26899 Other specified pregnancy related conditions, unspecified trimester: Secondary | ICD-10-CM

## 2020-09-23 LAB — URINALYSIS, ROUTINE W REFLEX MICROSCOPIC
Bilirubin Urine: NEGATIVE
Glucose, UA: NEGATIVE mg/dL
Hgb urine dipstick: NEGATIVE
Ketones, ur: NEGATIVE mg/dL
Nitrite: NEGATIVE
Protein, ur: NEGATIVE mg/dL
Specific Gravity, Urine: 1.019 (ref 1.005–1.030)
pH: 6 (ref 5.0–8.0)

## 2020-09-23 LAB — ABO/RH: ABO/RH(D): A POS

## 2020-09-23 LAB — CBC
HCT: 38.6 % (ref 36.0–46.0)
Hemoglobin: 12.2 g/dL (ref 12.0–15.0)
MCH: 27.6 pg (ref 26.0–34.0)
MCHC: 31.6 g/dL (ref 30.0–36.0)
MCV: 87.3 fL (ref 80.0–100.0)
Platelets: 317 10*3/uL (ref 150–400)
RBC: 4.42 MIL/uL (ref 3.87–5.11)
RDW: 15.3 % (ref 11.5–15.5)
WBC: 5.9 10*3/uL (ref 4.0–10.5)
nRBC: 0 % (ref 0.0–0.2)

## 2020-09-23 LAB — COMPREHENSIVE METABOLIC PANEL
ALT: 6 U/L (ref 0–44)
AST: 15 U/L (ref 15–41)
Albumin: 3.3 g/dL — ABNORMAL LOW (ref 3.5–5.0)
Alkaline Phosphatase: 27 U/L — ABNORMAL LOW (ref 38–126)
Anion gap: 10 (ref 5–15)
BUN: 6 mg/dL (ref 6–20)
CO2: 20 mmol/L — ABNORMAL LOW (ref 22–32)
Calcium: 9.1 mg/dL (ref 8.9–10.3)
Chloride: 104 mmol/L (ref 98–111)
Creatinine, Ser: 0.67 mg/dL (ref 0.44–1.00)
GFR, Estimated: >60 mL/min (ref >60)
Glucose, Bld: 75 mg/dL (ref 70–99)
Potassium: 3.7 mmol/L (ref 3.5–5.1)
Sodium: 134 mmol/L — ABNORMAL LOW (ref 135–145)
Total Bilirubin: 0.7 mg/dL (ref 0.3–1.2)
Total Protein: 7.1 g/dL (ref 6.5–8.1)

## 2020-09-23 LAB — WET PREP, GENITAL
Clue Cells Wet Prep HPF POC: NONE SEEN
Sperm: NONE SEEN
Trich, Wet Prep: NONE SEEN
Yeast Wet Prep HPF POC: NONE SEEN

## 2020-09-23 LAB — HCG, QUANTITATIVE, PREGNANCY: hCG, Beta Chain, Quant, S: 209865 m[IU]/mL — ABNORMAL HIGH (ref <5)

## 2020-09-23 MED ORDER — COMFORT FIT MATERNITY SUPP SM MISC: 1.0000 [IU] | Freq: Every day | 0 refills | Status: AC | PRN

## 2020-09-23 MED ORDER — PROMETHAZINE HCL 25 MG/ML IJ SOLN
25.0000 mg | Freq: Once | INTRAMUSCULAR | Status: AC
  Administered 2020-09-23: 25 mg via INTRAVENOUS
  Filled 2020-09-23: qty 1

## 2020-09-23 MED ORDER — LACTATED RINGERS IV BOLUS
1000.0000 mL | Freq: Once | INTRAVENOUS | Status: AC
  Administered 2020-09-23: 1000 mL via INTRAVENOUS

## 2020-09-23 NOTE — MAU Note (Addendum)
Pt reports she has had n/v for several weeks. Taking Zofran and b6(vitiamins ) as prescribed not helping much anymore.Also c/o abd pain and cramping since Saturday. Pain gets worse when she walks. Denies any vag bleeding. Also c/o increased fatigue.

## 2020-09-23 NOTE — MAU Provider Note (Cosign Needed)
Chief Complaint:  Emesis and Abdominal Pain   Event Date/Time   First Provider Initiated Contact with Patient 09/23/20 1154     HPI: Tracy Hanna is a 30 y.o. [phone] at [redacted]w[redacted]d who presents to maternity admissions reporting nausea/vomiting for the past few weeks, worst for the past two days - she has not been able to keep anything down. She was taking zofran and B6 but that has stopped working. Also complaining of cramping and low back pain since Saturday, worst when she stands or walks for too long. She states the cramping intensifies and feels like pressure and makes her feel light-headed. Denies vaginal bleeding, fever, falls, or recent illness. She is vaccinated against Covid and must test twice weekly for her job.  Past Medical History:  Diagnosis Date  . Anemia   . Anxiety AGE 73   NO MEDS CURRENTLY  . Infection    UTI WITH PREGNANCY  . Infection 2008   TRICH  . Infection    OCC yeast  . Ovarian cyst    with current pregnancy   . Premature dilatation of cervix during pregnancy 04/09/2012   3 cm   OB History  Gravida Para Term Preterm AB Living  4 3 2 1  0 3  SAB IAB Ectopic Multiple Live Births  0 0 0 0 3    # Outcome Date GA Lbr Len/2nd Weight Sex Delivery Anes PTL Lv  4 Current           3 Preterm 02/24/15 [redacted]w[redacted]d 17:01 / 00:13 6 lb 2.6 oz (2.795 kg) M Vag-Spont EPI  LIV  2 Term 06/05/12 [redacted]w[redacted]d 09:30 / 00:23 5 lb 13 oz (2.637 kg) F Vag-Spont Local  LIV  1 Term 11/29/07 [redacted]w[redacted]d 12:00 5 lb 14 oz (2.665 kg) F Vag-Spont EPI  LIV     Birth Comments: ?PROM 32 WKS   Past Surgical History:  Procedure Laterality Date  . NO PAST SURGERIES     Family History  Problem Relation Age of Onset  . Hypertension Mother   . Asthma Father   . Hypertension Father   . Asthma Brother   . Asthma Sister   . Early death Sister 5       ASTHMA  . Asthma Maternal Aunt   . Arthritis Maternal Grandmother   . Diabetes Maternal Grandmother   . Hypertension Maternal Grandmother   . Arthritis  Maternal Grandfather   . Other Neg Hx    Social History   Tobacco Use  . Smoking status: Never Smoker  . Smokeless tobacco: Never Used  Substance Use Topics  . Alcohol use: Not Currently    Comment: occ  . Drug use: No   No Known Allergies Medications Prior to Admission  Medication Sig Dispense Refill Last Dose  . clotrimazole (GYNE-LOTRIMIN) 1 % vaginal cream Place 1 Applicatorful vaginally at bedtime. 45 g 0   . cyclobenzaprine (FLEXERIL) 10 MG tablet Take 1 tablet (10 mg total) by mouth 3 (three) times daily as needed for muscle spasms. 20 tablet 0   . Doxylamine-Pyridoxine 10-10 MG TBEC Take this at night before bed.  If you continue to have nausea and vomiting take twice a day. 60 tablet 0   . HYDROcodone-acetaminophen (NORCO) 5-325 MG tablet Take 1 tablet by mouth every 6 (six) hours as needed for severe pain. 8 tablet 0   . metoCLOPramide (REGLAN) 10 MG tablet Take 1 tablet (10 mg total) by mouth every 6 (six) hours. 30 tablet 0  I have reviewed patient's Past Medical Hx, Surgical Hx, Family Hx, Social Hx, medications and allergies.   ROS:  Review of Systems  Constitutional: Negative for chills, fatigue and fever.  HENT: Negative for congestion, rhinorrhea and sore throat.   Eyes: Negative for photophobia and visual disturbance.  Respiratory: Negative for cough and shortness of breath.   Cardiovascular: Negative for chest pain.  Gastrointestinal: Positive for abdominal pain, nausea and vomiting.  Genitourinary: Negative for vaginal bleeding.  Musculoskeletal: Positive for back pain.  Neurological: Positive for light-headedness (upon standing/walking for too long). Negative for dizziness, syncope and headaches.  All other systems reviewed and are negative.  Physical Exam   Patient Vitals for the past 24 hrs:  BP Temp Pulse Resp Height Weight  09/23/20 1026 (!) 128/92 98.5 F (36.9 C) 88 18 5\' 4"  (1.626 m) 187 lb (84.8 kg)   Constitutional: Well-developed,  well-nourished female appearing fatigued and weak-feeling.  Cardiovascular: normal rate & rhythm, no murmur Respiratory: normal effort, lung sounds clear throughout GI: Abd soft, non-tender, gravid appropriate for gestational age. Pos BS x 4 MS: Extremities nontender, no edema, normal ROM Neurologic: Alert and oriented x 4.  GU: no CVA tenderness Pelvic: NEFG, off white to yellow discharge, no blood, cervix clean. No masses or discoloration noted in the introitus or in the posterior fornix.    Labs: Results for orders placed or performed during the hospital encounter of 09/23/20 (from the past 24 hour(s))  Urinalysis, Routine w reflex microscopic Urine, Clean Catch     Status: Abnormal   Collection Time: 09/23/20 10:58 AM  Result Value Ref Range   Color, Urine YELLOW YELLOW   APPearance HAZY (A) CLEAR   Specific Gravity, Urine 1.019 1.005 - 1.030   pH 6.0 5.0 - 8.0   Glucose, UA NEGATIVE NEGATIVE mg/dL   Hgb urine dipstick NEGATIVE NEGATIVE   Bilirubin Urine NEGATIVE NEGATIVE   Ketones, ur NEGATIVE NEGATIVE mg/dL   Protein, ur NEGATIVE NEGATIVE mg/dL   Nitrite NEGATIVE NEGATIVE   Leukocytes,Ua MODERATE (A) NEGATIVE   RBC / HPF 0-5 0 - 5 RBC/hpf   WBC, UA 0-5 0 - 5 WBC/hpf   Bacteria, UA RARE (A) NONE SEEN   Squamous Epithelial / LPF 0-5 0 - 5   Mucus PRESENT    Amorphous Crystal PRESENT   CBC     Status: None   Collection Time: 09/23/20 12:35 PM  Result Value Ref Range   WBC 5.9 4.0 - 10.5 K/uL   RBC 4.42 3.87 - 5.11 MIL/uL   Hemoglobin 12.2 12.0 - 15.0 g/dL   HCT 11/21/20 62.9 - 47.6 %   MCV 87.3 80.0 - 100.0 fL   MCH 27.6 26.0 - 34.0 pg   MCHC 31.6 30.0 - 36.0 g/dL   RDW 54.6 50.3 - 54.6 %   Platelets 317 150 - 400 K/uL   nRBC 0.0 0.0 - 0.2 %  ABO/Rh     Status: None   Collection Time: 09/23/20 12:35 PM  Result Value Ref Range   ABO/RH(D) A POS    No rh immune globuloin      NOT A RH IMMUNE GLOBULIN CANDIDATE, PT RH POSITIVE Performed at [facility], [address] [place], [place]   hCG, quantitative, pregnancy     Status: Abnormal   Collection Time: 09/23/20 12:35 PM  Result Value Ref Range   hCG, Beta Chain03/07/22, S Sharene Butters (H) <5 mIU/mL  Comprehensive metabolic panel  Status: Abnormal   Collection Time: 09/23/20 12:35 PM  Result Value Ref Range   Sodium 134 (L) 135 - 145 mmol/L   Potassium 3.7 3.5 - 5.1 mmol/L   Chloride 104 98 - 111 mmol/L   CO2 20 (L) 22 - 32 mmol/L   Glucose, Bld 75 70 - 99 mg/dL   BUN 6 6 - 20 mg/dL   Creatinine, Ser 1.61 0.44 - 1.00 mg/dL   Calcium 9.1 8.9 - 09.6 mg/dL   Total Protein 7.1 6.5 - 8.1 g/dL   Albumin 3.3 (L) 3.5 - 5.0 g/dL   AST 15 15 - 41 U/L   ALT 6 0 - 44 U/L   Alkaline Phosphatase 27 (L) 38 - 126 U/L   Total Bilirubin 0.7 0.3 - 1.2 mg/dL   GFR, Estimated >04 >54 mL/min   Anion gap 10 5 - 15   Imaging:  US OB LESS THAN 14 WEEKS WITH OB TRANSVAGINAL  Result Date: 09/23/2020 CLINICAL DATA:  Abdominal pain EXAM: OBSTETRIC <14 WK [place] AND TRANSVAGINAL OB US TECHNIQUE: Both transabdominal and transvaginal ultrasound examinations were performed for complete evaluation of the gestation as well as the maternal uterus, adnexal regions, and pelvic cul-de-sac. Transvaginal technique was performed to assess early pregnancy. COMPARISON:  None. FINDINGS: Intrauterine gestational sac: Single Yolk sac:  Visualized. Embryo:  Visualized. Cardiac Activity: Visualized. Heart Rate: 166 bpm MSD:   mm    w     d CRL:  45.5 mm   11 w   2 d                  [place] EDC: April 12, 2021 Subchorionic hemorrhage:  None visualized. Maternal uterus/adnexae: Normal in appearance. There is a small amount of free fluid posterior to the cervix. IMPRESSION: 1. Single live intrauterine pregnancy. 2. No cause for pain identified. Electronically Signed   By: Gerome Sam III M.D   On: 09/23/2020 14:35   MAU Course: Orders Placed This Encounter  Procedures  . Wet prep, genital  . US OB LESS THAN 14 WEEKS WITH OB TRANSVAGINAL   . Urinalysis, Routine w reflex microscopic Urine, Clean Catch  . CBC  . hCG, quantitative, pregnancy  . Comprehensive metabolic panel  . ABO/Rh  . Discharge patient   Meds ordered this encounter  Medications  . lactated ringers bolus 1,000 mL  . promethazine (PHENERGAN) injection 25 mg  . Elastic Bandages & Supports (COMFORT FIT MATERNITY SUPP SM) MISC    Sig: 1 Units by Does not apply route daily as needed.    Dispense:  1 each    Refill:  0    Order Specific Question:   Supervising Provider    Answer:   Samara Snide   MDM: LR bolus with phenergan IV given with good relief of nausea  Wet prep normal, GC/CT pending U/S normal but showed an area of free fluid posterior to the cervix. Dr. Donavan Foil reviewed ultrasound report and presentation of patient, did not find correlation of small amount of free fluid to her description of pain given her pelvic exam.  Maternity belt prescription printed, discussed use of it and round ligament pain relief methods with patient.   Assessment: 1. Abdominal pain affecting pregnancy   2. Abdominal pain during pregnancy in first trimester   3. [redacted] weeks gestation of pregnancy   4. Fetal heart tones present, first trimester   5. Nausea and vomiting during pregnancy prior to [redacted] weeks gestation  Plan: Discharge home in stable condition with first trimester precautions Pt to establish routine OB care  Allergies as of 09/23/2020   No Known Allergies     Medication List    TAKE these medications   clotrimazole 1 % vaginal cream Commonly known as: GYNE-LOTRIMIN Place 1 Applicatorful vaginally at bedtime.   Comfort Fit Maternity Supp Sm Misc 1 Units by Does not apply route daily as needed.   cyclobenzaprine 10 MG tablet Commonly known as: FLEXERIL Take 1 tablet (10 mg total) by mouth 3 (three) times daily as needed for muscle spasms.   Doxylamine-Pyridoxine 10-10 MG Tbec Take this at night before bed.  If you continue to have nausea  and vomiting take twice a day.   HYDROcodone-acetaminophen 5-325 MG tablet Commonly known as: Norco Take 1 tablet by mouth every 6 (six) hours as needed for severe pain.   metoCLOPramide 10 MG tablet Commonly known as: REGLAN Take 1 tablet (10 mg total) by mouth every 6 (six) hours.      Gaylan Gerold, CNM, MSN, [facility]

## 2020-09-23 NOTE — Discharge Instructions (Signed)
PREGNANCY SUPPORT BELT: You are not alone, Seventy-five percent of women have some sort of abdominal or back pain at some point in their pregnancy. Your baby is growing at a fast pace, which means that your whole body is rapidly trying to adjust to the changes. As your uterus grows, your back may start feeling a bit under stress and this can result in back or abdominal pain that can go from mild, and therefore bearable, to severe pains that will not allow you to sit or lay down comfortably, When it comes to dealing with pregnancy-related pains and cramps, some pregnant women usually prefer natural remedies, which the market is filled with nowadays. For example, wearing a pregnancy support belt can help ease and lessen your discomfort and pain. WHAT ARE THE BENEFITS OF WEARING A PREGNANCY SUPPORT BELT? A pregnancy support belt provides support to the lower portion of the belly taking some of the weight of the growing uterus and distributing to the other parts of your body. It is designed make you comfortable and gives you extra support. Over the years, the pregnancy apparel market has been studying the needs and wants of pregnant women and they have come up with the most comfortable pregnancy support belts that woman could ever ask for. In fact, you will no longer have to wear a stretched-out or bulky pregnancy belt that is visible underneath your clothes and makes you feel even more uncomfortable. Nowadays, a pregnancy support belt is made of comfortable and stretchy materials that will not irritate your skin but will actually make you feel at ease and you will not even notice you are wearing it. They are easy to put on and adjust during the day and can be worn at night for additional support.  BENEFITS:  Relives Back pain  Relieves Abdominal Muscle and Leg Pain  Stabilizes the Pelvic Ring  Offers a Cushioned Abdominal Lift Pad  Relieves pressure on the Sciatic Nerve Within Minutes WHERE TO GET  YOUR PREGNANCY BELT:  [facility] at [address] [place] [address]  [phone] Or [facility] at [address], [place], [place] [address]  [phone]   Round Ligament Pain  The round ligament is a cord of muscle and tissue that helps support the uterus. It can become a source of pain during pregnancy if it becomes stretched or twisted as the baby grows. The pain usually begins in the second trimester (13-28 weeks) of pregnancy, and it can come and go until the baby is delivered. It is not a serious problem, and it does not cause harm to the baby. Round ligament pain is usually a short, sharp, and pinching pain, but it can also be a dull, lingering, and aching pain. The pain is felt in the lower side of the abdomen or in the groin. It usually starts deep in the groin and moves up to the outside of the hip area. The pain may occur when you:  Suddenly change position, such as quickly going from a sitting to standing position.  Roll over in bed.  Cough or sneeze.  Do physical activity. Follow these instructions at home:   Watch your condition for any changes.  When the pain starts, relax. Then try any of these methods to help with the pain: ? Sitting down. ? Flexing your knees up to your abdomen. ? Lying on your side with one pillow under your abdomen and another pillow between your legs. ? Sitting in a warm bath  for 15-20 minutes or until the pain goes away.  Take over-the-counter and prescription medicines only as told by your health care provider.  Move slowly when you sit down or stand up.  Avoid long walks if they cause pain.  Stop or reduce your physical activities if they cause pain.  Keep all follow-up visits as told by your health care provider. This is important. Contact a health care provider if:  Your pain does not go away with treatment.  You feel pain in your back that you did not have before.  Your medicine is  not helping. Get help right away if:  You have a fever or chills.  You develop uterine contractions.  You have vaginal bleeding.  You have nausea or vomiting.  You have diarrhea.  You have pain when you urinate. Summary  Round ligament pain is felt in the lower abdomen or groin. It is usually a short, sharp, and pinching pain. It can also be a dull, lingering, and aching pain.  This pain usually begins in the second trimester (13-28 weeks). It occurs because the uterus is stretching with the growing baby, and it is not harmful to the baby.  You may notice the pain when you suddenly change position, when you cough or sneeze, or during physical activity.  Relaxing, flexing your knees to your abdomen, lying on one side, or taking a warm bath may help to get rid of the pain.  Get help from your health care provider if the pain does not go away or if you have vaginal bleeding, nausea, vomiting, diarrhea, or painful urination. This information is not intended to replace advice given to you by your health care provider. Make sure you discuss any questions you have with your health care provider. Document Revised: 02/19/2018 Document Reviewed: 02/19/2018 Elsevier Patient Education  2020 [employer].

## 2020-09-26 LAB — GC/CHLAMYDIA PROBE AMP (~~LOC~~) NOT AT ARMC
Chlamydia: NEGATIVE
Comment: NEGATIVE
Comment: NORMAL
Neisseria Gonorrhea: NEGATIVE

## 2022-06-30 IMAGING — US US OB < 14 WEEKS - US OB TV
1 series · 15 of 28 positions shown · non-contrast
Comparison: None.

CLINICAL DATA: Abdominal pain

EXAM:
OBSTETRIC <14 WK US AND TRANSVAGINAL OB US
TECHNIQUE: Both transabdominal and transvaginal ultrasound examinations were
performed for complete evaluation of the gestation as well as the
maternal uterus, adnexal regions, and pelvic cul-de-sac.
Transvaginal technique was performed to assess early pregnancy.

[Series 1: us ob < 14 weeks - us ob tv · 15 of 77 slices shown]
[im 1/77]
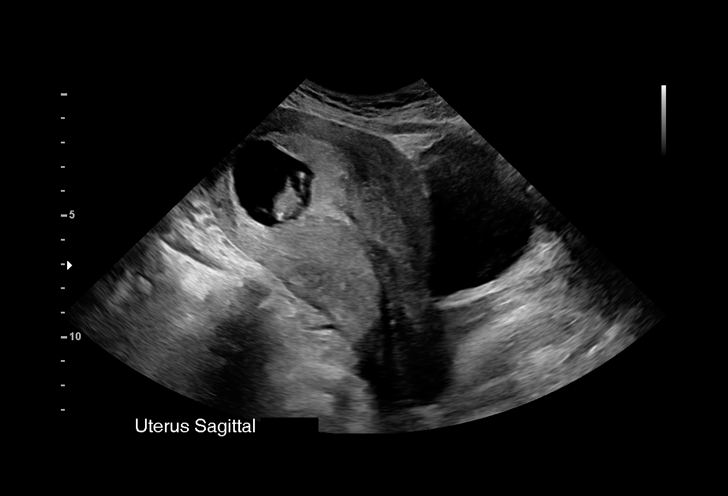
[im 6/77]
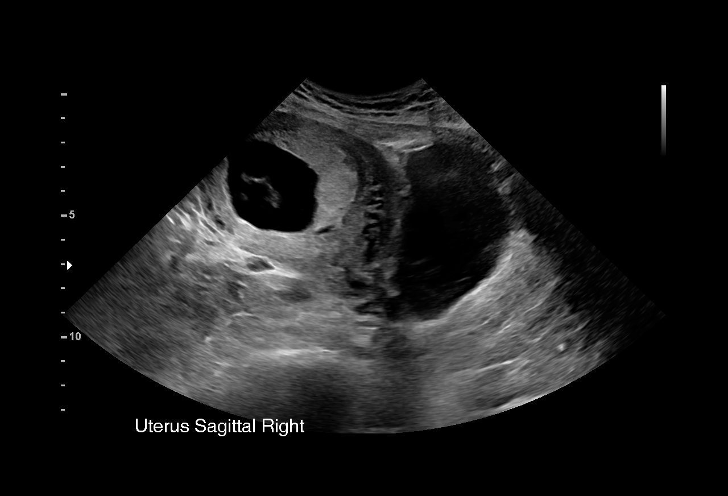
[im 12/77]
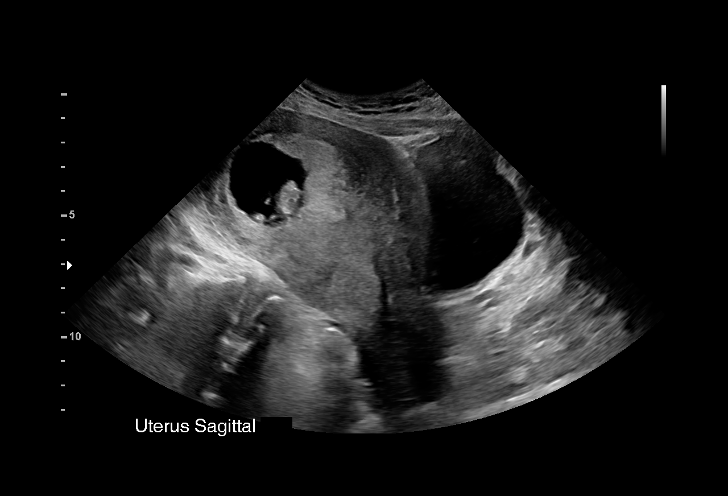
[im 17/77]
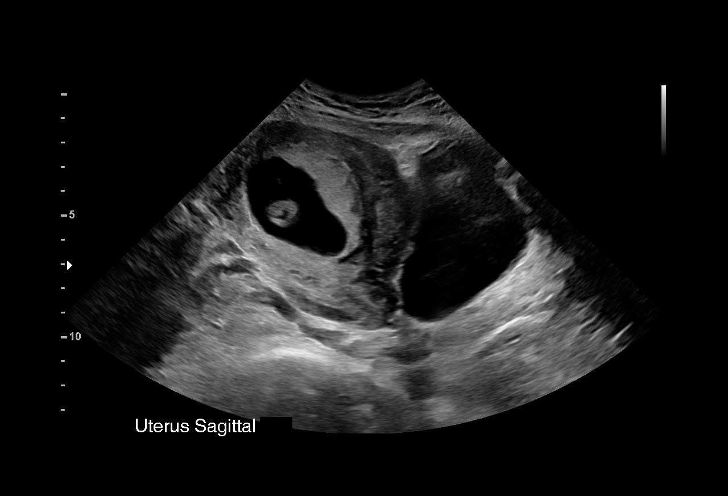
[im 23/77]
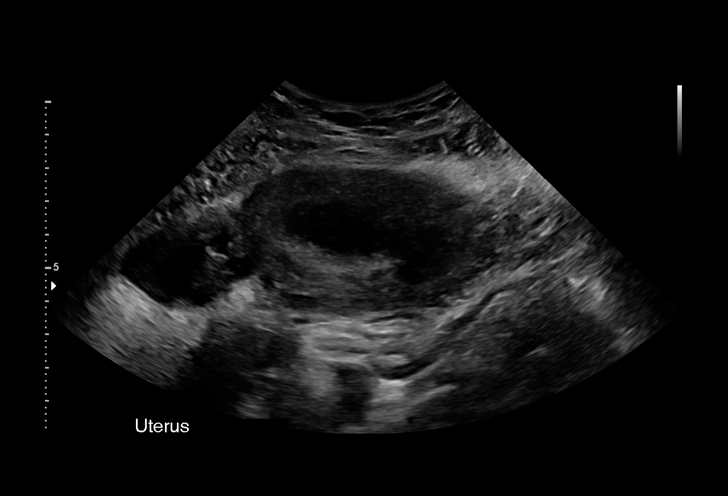
[im 29/77]
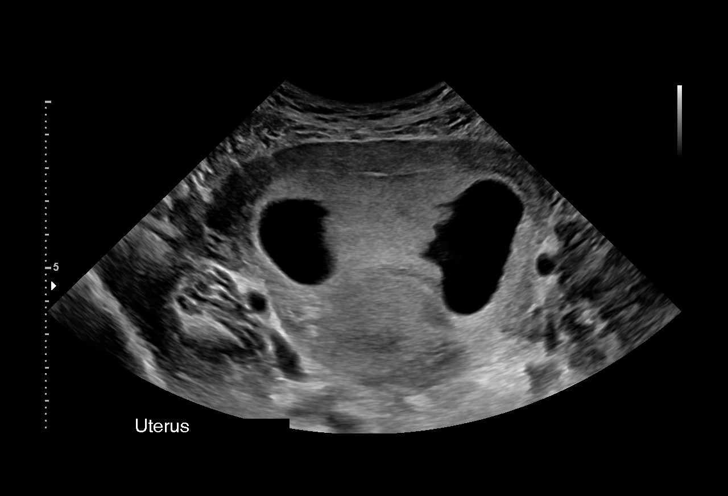
[im 34/77]
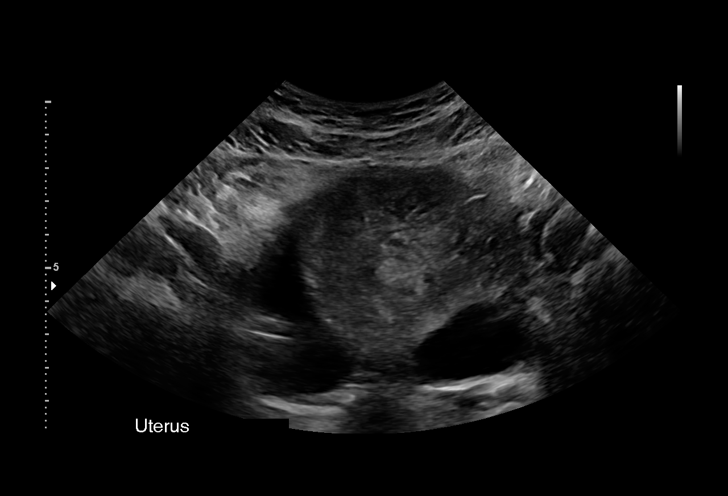
[im 40/77]
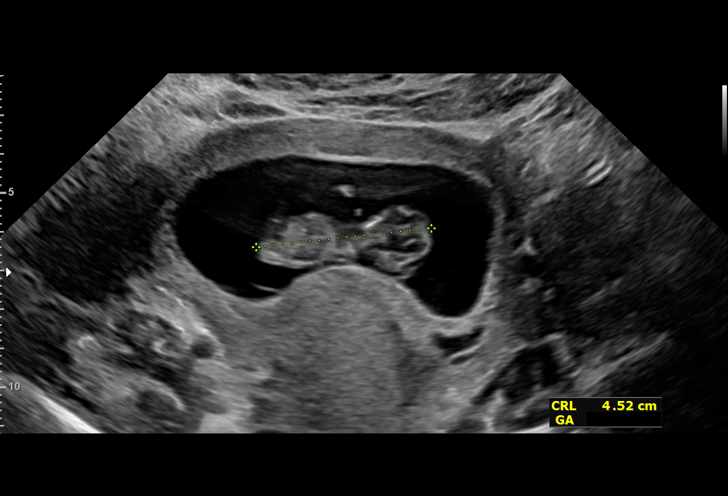
[im 43/77]
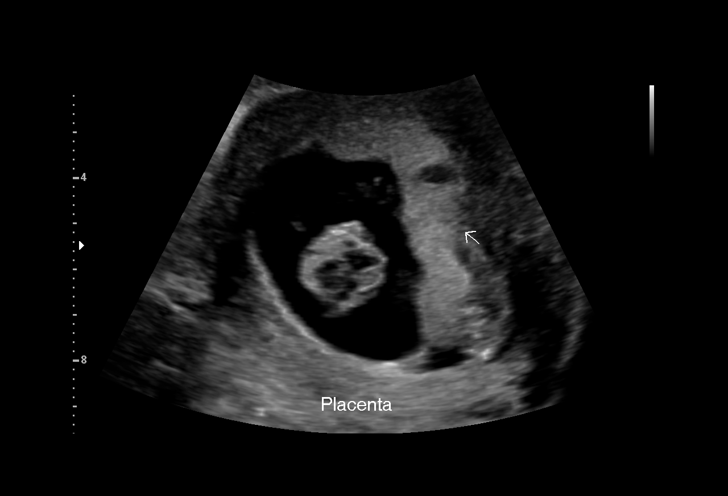
[im 48/77]
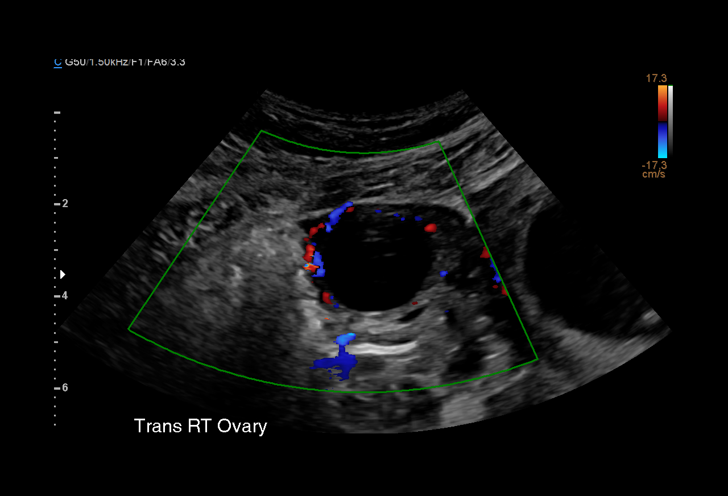
[im 54/77]
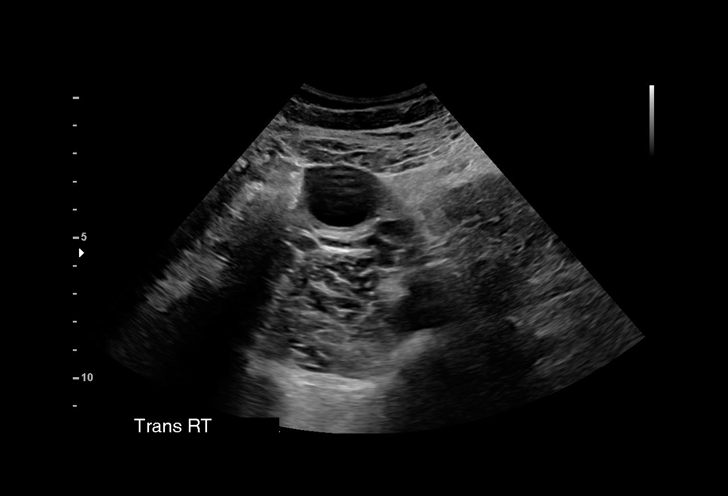
[im 60/77]
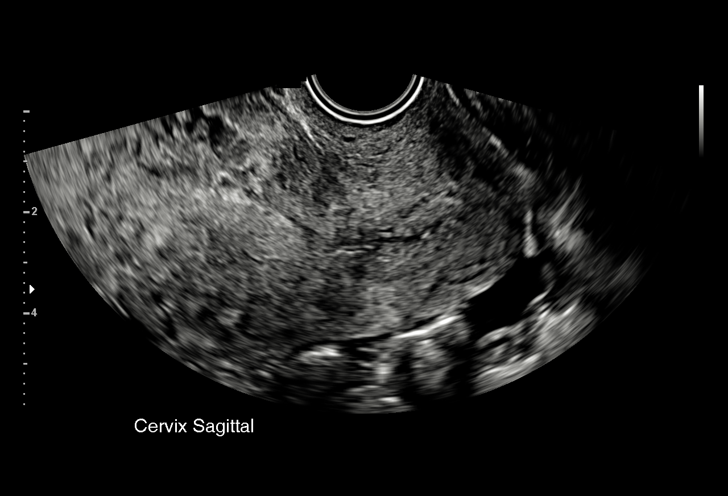
[im 65/77]
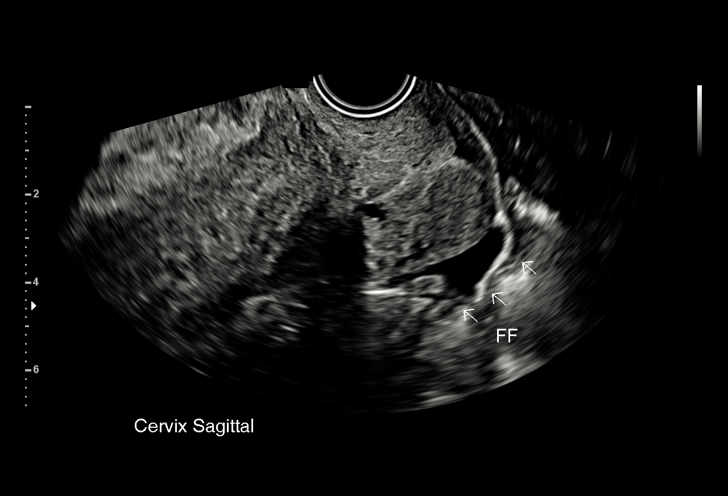
[im 71/77]
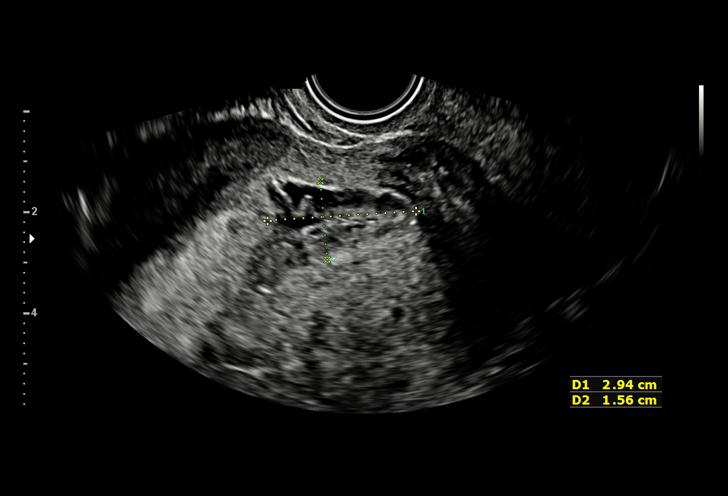
[im 77/77]
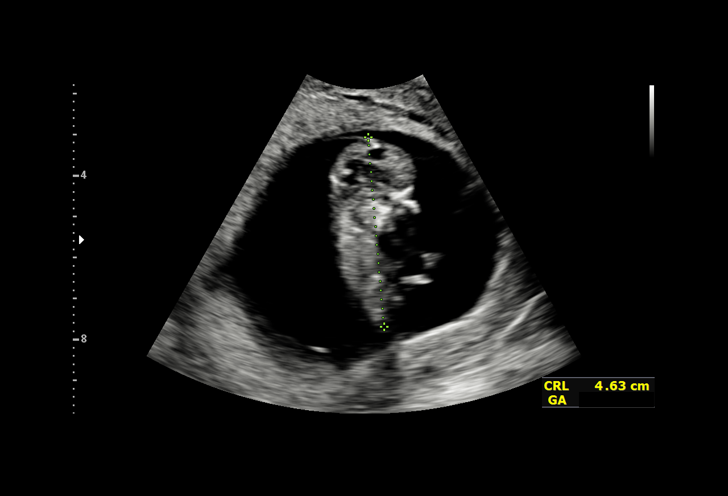

[15 of 28 positions shown; findings below may reference images not displayed]

FINDINGS: Intrauterine gestational sac: Single

Yolk sac:  Visualized.

Embryo:  Visualized.

Cardiac Activity: Visualized.

Heart Rate: 166 bpm

MSD:   mm    w     d

CRL:  45.5 mm   11 w   2 d                  US EDC: April 12, 2021

Subchorionic hemorrhage:  None visualized.

Maternal uterus/adnexae: Normal in appearance.

There is a small amount of free fluid posterior to the cervix.
IMPRESSION: 1. Single live intrauterine pregnancy.
2. No cause for pain identified.

## 2024-10-22 ENCOUNTER — Encounter (HOSPITAL_BASED_OUTPATIENT_CLINIC_OR_DEPARTMENT_OTHER): Payer: Self-pay

## 2024-10-22 ENCOUNTER — Emergency Department (HOSPITAL_BASED_OUTPATIENT_CLINIC_OR_DEPARTMENT_OTHER)
Admission: EM | Admit: 2024-10-22 | Discharge: 2024-10-22 | Disposition: A | Discharge status: Home / Self Care | Payer: Self-pay | Source: Home / Self Care | Attending: Emergency Medicine | Admitting: Emergency Medicine

## 2024-10-22 ENCOUNTER — Other Ambulatory Visit: Payer: Self-pay

## 2024-10-22 ENCOUNTER — Emergency Department (HOSPITAL_BASED_OUTPATIENT_CLINIC_OR_DEPARTMENT_OTHER): Payer: Self-pay

## 2024-10-22 DIAGNOSIS — R42 Dizziness and giddiness: Secondary | ICD-10-CM

## 2024-10-22 LAB — CBC
HCT: 36.4 % (ref 36.0–46.0)
Hemoglobin: 11.6 g/dL — ABNORMAL LOW (ref 12.0–15.0)
MCH: 27.1 pg (ref 26.0–34.0)
MCHC: 31.9 g/dL (ref 30.0–36.0)
MCV: 85 fL (ref 80.0–100.0)
Platelets: 339 10*3/uL (ref 150–400)
RBC: 4.28 MIL/uL (ref 3.87–5.11)
RDW: 15.1 % (ref 11.5–15.5)
WBC: 5.5 10*3/uL (ref 4.0–10.5)
nRBC: 0 % (ref 0.0–0.2)

## 2024-10-22 LAB — URINALYSIS, ROUTINE W REFLEX MICROSCOPIC
Bilirubin Urine: NEGATIVE
Glucose, UA: NEGATIVE mg/dL
Hgb urine dipstick: NEGATIVE
Ketones, ur: NEGATIVE mg/dL
Nitrite: NEGATIVE
Protein, ur: NEGATIVE mg/dL
Specific Gravity, Urine: 1.02 (ref 1.005–1.030)
pH: 7 (ref 5.0–8.0)

## 2024-10-22 LAB — COMPREHENSIVE METABOLIC PANEL WITH GFR
ALT: <5 U/L (ref 0–44)
AST: 15 U/L (ref 15–41)
Albumin: 4.2 g/dL (ref 3.5–5.0)
Alkaline Phosphatase: 37 U/L — ABNORMAL LOW (ref 38–126)
Anion gap: 10 (ref 5–15)
BUN: 14 mg/dL (ref 6–20)
CO2: 23 mmol/L (ref 22–32)
Calcium: 9 mg/dL (ref 8.9–10.3)
Chloride: 107 mmol/L (ref 98–111)
Creatinine, Ser: 0.83 mg/dL (ref 0.44–1.00)
GFR, Estimated: >60 mL/min
Glucose, Bld: 113 mg/dL — ABNORMAL HIGH (ref 70–99)
Potassium: 3.7 mmol/L (ref 3.5–5.1)
Sodium: 139 mmol/L (ref 135–145)
Total Bilirubin: 0.5 mg/dL (ref 0.0–1.2)
Total Protein: 6.9 g/dL (ref 6.5–8.1)

## 2024-10-22 LAB — PREGNANCY, URINE: Preg Test, Ur: NEGATIVE

## 2024-10-22 LAB — URINALYSIS, MICROSCOPIC (REFLEX): RBC / HPF: NONE SEEN RBC/hpf (ref 0–5)

## 2024-10-22 LAB — CBG MONITORING, ED: Glucose-Capillary: 133 mg/dL — ABNORMAL HIGH (ref 70–99)

## 2024-10-22 MED ORDER — ONDANSETRON HCL 4 MG/2ML IJ SOLN
4.0000 mg | Freq: Once | INTRAMUSCULAR | Status: AC
  Administered 2024-10-22: 4 mg via INTRAVENOUS
  Filled 2024-10-22: qty 2

## 2024-10-22 MED ORDER — MECLIZINE HCL 25 MG PO TABS: 25.0000 mg | ORAL_TABLET | Freq: Three times a day (TID) | ORAL | 0 refills | Status: AC | PRN

## 2024-10-22 MED ORDER — MECLIZINE HCL 25 MG PO TABS
25.0000 mg | ORAL_TABLET | Freq: Once | ORAL | Status: AC
  Administered 2024-10-22: 25 mg via ORAL
  Filled 2024-10-22: qty 1

## 2024-10-22 MED ORDER — SODIUM CHLORIDE 0.9 % IV BOLUS
1000.0000 mL | Freq: Once | INTRAVENOUS | Status: AC
  Administered 2024-10-22: 1000 mL via INTRAVENOUS

## 2024-10-22 MED ORDER — ONDANSETRON HCL 4 MG PO TABS: 4.0000 mg | ORAL_TABLET | Freq: Four times a day (QID) | ORAL | 0 refills | Status: AC

## 2024-10-22 NOTE — ED Triage Notes (Signed)
 Pt is coming in after working 3rd shift lastnight, states she woke up and was super dizzy and passed out and woke up again and drove here. She is feeling nauseated. No chest pain but has some shortness of breath.

## 2024-10-22 NOTE — ED Provider Notes (Signed)
" °  Physical Exam  BP 117/82   Pulse 65   Temp 97.8 F (36.6 C)   Resp 16   SpO2 97%   Physical Exam  Procedures  Procedures  ED Course / MDM    Medical Decision Making Amount and/or Complexity of Data Reviewed Labs: ordered. Radiology: ordered.  Risk Prescription drug management.   Patient received in handoff from prior APP.  Please see his note for full details.  Briefly, the patient came in for possible migraine, and felt better after treatment.  Her lab work was not yet back at the time of shift handoff.  Her discharge was prepped.  Patient's urine came back unremarkable.  She was ultimately discharged, with resolution of symptoms.      Tracy Hanna Tracy Hanna 10/22/24 2039    Tracy Jerilynn GORMAN, MD 10/22/24 2233  "

## 2024-10-22 NOTE — ED Provider Notes (Incomplete)
 " [place] [facility] AT [facility] Provider Note   CSN: [id number] Arrival date & time: 10/22/24  1624     Patient presents with: Dizziness   Tracy Hanna is a 34 y.o. female.  34 year old female presents emergency department with complaints of sudden onset of dizziness.  Patient reports that she works third shift and got home at 9 AM.  She reports going to sleep at this time when she woke up at 4 PM today she experienced a near syncopal episode when getting out of bed and went back to sleep after this.  Patient reports when she woke up and attempted to get up she had dizziness like the room was spinning and she just laid there for a while.  She had some associated nausea at this time.  Patient reports symptoms began to resolve.  She was able to gather her kids and come to emergency department.  She denies any shortness of breath, visual loss, weakness, chest pain, loss of consciousness.  She reports significant history of anemia secondary to heavy periods, and anxiety.      Prior to Admission medications  Medication Sig Start Date End Date Taking? Authorizing Provider  meclizine  (ANTIVERT ) 25 MG tablet Take 1 tablet (25 mg total) by mouth 3 (three) times daily as needed for dizziness. 10/22/24  Yes Myriam Fonda RAMAN, PA-C  ondansetron  (ZOFRAN ) 4 MG tablet Take 1 tablet (4 mg total) by mouth every 6 (six) hours. 10/22/24  Yes Myriam Fonda S, PA-C  clotrimazole  (GYNE-LOTRIMIN ) 1 % vaginal cream Place 1 Applicatorful vaginally at bedtime. 08/31/20   Emil Share, DO  cyclobenzaprine  (FLEXERIL ) 10 MG tablet Take 1 tablet (10 mg total) by mouth 3 (three) times daily as needed for muscle spasms. 05/29/20   Molpus, John, MD  Doxylamine -Pyridoxine  10-10 MG TBEC Take this at night before bed.  If you continue to have nausea and vomiting take twice a day. 08/31/20   Emil Share, DO  Elastic Bandages & Supports (COMFORT FIT MATERNITY SUPP SM) MISC 1 Units by Does not apply route  daily as needed. 09/23/20   Walker, Jamilla R, CNM  HYDROcodone -acetaminophen  (NORCO) 5-325 MG tablet Take 1 tablet by mouth every 6 (six) hours as needed for severe pain. 05/29/20   Molpus, John, MD  metoCLOPramide  (REGLAN ) 10 MG tablet Take 1 tablet (10 mg total) by mouth every 6 (six) hours. 08/31/20   Floyd, Dan, DO    Allergies: Patient has no known allergies.    Review of Systems  Gastrointestinal:  Positive for nausea.  Neurological:  Positive for dizziness and light-headedness.  All other systems reviewed and are negative.   Updated Vital Signs BP 117/82   Pulse 65   Temp 97.8 F (36.6 C)   Resp 16   SpO2 97%   Physical Exam Vitals and nursing note reviewed.  Constitutional:      General: She is not in acute distress.    Appearance: Normal appearance. She is not ill-appearing or toxic-appearing.  HENT:     Head: Normocephalic and atraumatic.     Nose: Nose normal.  Eyes:     Extraocular Movements: Extraocular movements intact.     Conjunctiva/sclera: Conjunctivae normal.     Pupils: Pupils are equal, round, and reactive to light.  Cardiovascular:     Rate and Rhythm: Normal rate and regular rhythm.  Pulmonary:     Effort: Pulmonary effort is normal. No respiratory distress.     Breath sounds: Normal breath sounds.  Musculoskeletal:        General: Normal range of motion.     Cervical back: Normal range of motion.  Skin:    General: Skin is warm.     Capillary Refill: Capillary refill takes less than 2 seconds.     Coloration: Skin is not pale.  Neurological:     General: No focal deficit present.     Mental Status: She is alert.     Comments: Patient does report some continued mild dizziness.  No visual disturbances, hemineglect, weakness, focal deficits noted on exam.  Patient is ambulatory.  Psychiatric:        Mood and Affect: Mood normal.        Behavior: Behavior normal.     (all labs ordered are listed, but only abnormal results are displayed) Labs  Reviewed  COMPREHENSIVE METABOLIC PANEL WITH GFR - Abnormal; Notable for the following components:      Result Value   Glucose, Bld 113 (*)    Alkaline Phosphatase 37 (*)    All other components within normal limits  CBC - Abnormal; Notable for the following components:   Hemoglobin 11.6 (*)    All other components within normal limits  URINALYSIS, ROUTINE W REFLEX MICROSCOPIC - Abnormal; Notable for the following components:   Leukocytes,Ua TRACE (*)    All other components within normal limits  URINALYSIS, MICROSCOPIC (REFLEX) - Abnormal; Notable for the following components:   Bacteria, UA RARE (*)    All other components within normal limits  CBG MONITORING, ED - Abnormal; Notable for the following components:   Glucose-Capillary 133 (*)    All other components within normal limits  PREGNANCY, URINE    EKG: EKG Interpretation Date/Time:  Thursday October 22 2024 16:37:54 EST Ventricular Rate:  65 PR Interval:  146 QRS Duration:  89 QT Interval:  410 QTC Calculation: 427 R Axis:   72  Text Interpretation: Sinus rhythm RSR' in V1 or V2, probably normal variant Confirmed by Rogelia Satterfield ([id number]) on 10/22/2024 6:21:46 PM  Radiology: CT Head Wo Contrast Result Date: 10/22/2024 EXAM: CT HEAD WITHOUT CONTRAST 10/22/2024 06:25:00 PM TECHNIQUE: CT of the head was performed without the administration of intravenous contrast. Automated exposure control, iterative reconstruction, and/or weight based adjustment of the mA/kV was utilized to reduce the radiation dose to as low as reasonably achievable. COMPARISON: None available. CLINICAL HISTORY: Syncope/presyncope; cerebrovascular cause suspected. FINDINGS: BRAIN AND VENTRICLES: Basal ganglia calcifications. No acute hemorrhage. No evidence of acute infarct. No hydrocephalus. No extra-axial collection. No mass effect or midline shift. SINUSES: No acute abnormality. SOFT TISSUES AND SKULL: No acute soft tissue abnormality. No skull  fracture. IMPRESSION: 1. No acute intracranial abnormality. Electronically signed by: Pinkie Pebbles MD 10/22/2024 06:30 PM EST RP Workstation: [id number]     Procedures   Medications Ordered in the ED  sodium chloride  0.9 % bolus 1,000 mL (0 mLs Intravenous Stopped 10/22/24 1909)  ondansetron  (ZOFRAN ) injection 4 mg (4 mg Intravenous Given 10/22/24 1803)  meclizine  (ANTIVERT ) tablet 25 mg (25 mg Oral Given 10/22/24 1801)    34 y.o. female presents to the ED with complaints of dizziness, The differential diagnosis includes CVA, dehydration, vertigo, intracranial mass, (Ddx)  On arrival pt is nontoxic, vitals unremarkable. Exam overall unremarkable patient still reports some mild dizziness.  I ordered medication Zofran  and meclizine  for nausea and dizziness.  Lab Tests:  CMP CBC UA urine Prag ordered in triage. No significant findings on CMP or CBC.  Pending UA at  shift change.  Imaging Studies ordered:  I ordered imaging studies which included CT head without contrast, no significant findings  ED Course:   Patient is sitting comfortably in ED bed in no acute distress nontoxic-appearing.  Patient reports symptoms have already started to resolve.  Patient has no neurological deficits, or visual disturbances.  Low concern for CVA at this time.  Will get CT treat nausea and dizziness.  No concerning findings on lab work.  CT was unremarkable EKG is nonischemic with no noted arrhythmias.  Patient reports feeling much better after Zofran  and meclizine .  Patient was able to ambulate to the bathroom without difficulty or assistance.  Patient reports she still has a funny feeling in her head but symptoms have significantly resolved.  Orthostatics negative.  On reassessment before discharge patient reports she is feeling much better.  Symptoms have resolved.  Pending UA patient will be discharged with ENT follow-up and PCP follow-up.  Patient was given strict return precautions and she agreed to  treatment plan.  Pending UA results at shift change.  Patient care transferred to [facility].    Portions of this note were generated with Scientist, clinical (histocompatibility and immunogenetics). Dictation errors may occur despite best attempts at proofreading.   Final diagnoses:  Dizziness  Orthostatic dizziness    ED Discharge Orders          Ordered    ondansetron  (ZOFRAN ) 4 MG tablet  Every 6 hours        10/22/24 1930    meclizine  (ANTIVERT ) 25 MG tablet  3 times daily PRN        10/22/24 1930               Myriam Fonda RAMAN, [place] 10/23/24 2145  "

## 2024-10-22 NOTE — ED Notes (Signed)
 Orthostatic vitals done and ambulatory to bathroom without difficulty. States she still has a little funny feeling in her head but is not dizzy and it is nothing compared to what it was.

## 2024-10-22 NOTE — Discharge Instructions (Addendum)
 Lab work imaging and exam were overall reassuring today.  Please ensure to drink plenty of fluids and try to get plenty of rest.  I have sent in meclizine  for dizziness and Zofran  for nausea.  Please follow-up with the ENT as soon as possible for further evaluation of dizziness.  I also recommend following up with your primary care provider for reassessment.  If you experience any concerning new or worsening symptoms please return to the emergency department for further evaluation.
